# Patient Record
Sex: Female | Born: 1940 | Race: White | Hispanic: No | Marital: Married | Smoking: Never smoker
Health system: Southern US, Community
[De-identification: ages and names within clinical notes are randomized; demographics above are authoritative.]

## PROBLEM LIST (undated history)

## (undated) ENCOUNTER — Emergency Department (HOSPITAL_BASED_OUTPATIENT_CLINIC_OR_DEPARTMENT_OTHER): Admission: EM | Source: Home / Self Care

## (undated) DIAGNOSIS — K219 Gastro-esophageal reflux disease without esophagitis: Secondary | ICD-10-CM

## (undated) DIAGNOSIS — Z8719 Personal history of other diseases of the digestive system: Secondary | ICD-10-CM

## (undated) DIAGNOSIS — C801 Malignant (primary) neoplasm, unspecified: Secondary | ICD-10-CM

## (undated) DIAGNOSIS — M8580 Other specified disorders of bone density and structure, unspecified site: Secondary | ICD-10-CM

## (undated) DIAGNOSIS — M1990 Unspecified osteoarthritis, unspecified site: Secondary | ICD-10-CM

## (undated) DIAGNOSIS — E213 Hyperparathyroidism, unspecified: Secondary | ICD-10-CM

## (undated) DIAGNOSIS — R21 Rash and other nonspecific skin eruption: Secondary | ICD-10-CM

## (undated) DIAGNOSIS — I10 Essential (primary) hypertension: Secondary | ICD-10-CM

## (undated) HISTORY — PX: OTHER SURGICAL HISTORY: SHX169

## (undated) HISTORY — PX: TONSILLECTOMY: SUR1361

---

## 1982-11-29 HISTORY — PX: APPENDECTOMY: SHX54

## 1982-11-29 HISTORY — PX: ABDOMINAL HYSTERECTOMY: SHX81

## 1985-11-29 HISTORY — PX: OTHER SURGICAL HISTORY: SHX169

## 1998-11-11 ENCOUNTER — Other Ambulatory Visit: Admission: RE | Admit: 1998-11-11 | Discharge: 1998-11-11 | Payer: Self-pay | Admitting: Obstetrics and Gynecology

## 1999-11-09 ENCOUNTER — Ambulatory Visit (HOSPITAL_BASED_OUTPATIENT_CLINIC_OR_DEPARTMENT_OTHER): Admission: RE | Admit: 1999-11-09 | Discharge: 1999-11-09 | Payer: Self-pay | Admitting: Orthopedic Surgery

## 1999-11-30 HISTORY — PX: LAMINECTOMY: SHX219

## 2000-03-08 ENCOUNTER — Ambulatory Visit (HOSPITAL_COMMUNITY): Admission: RE | Admit: 2000-03-08 | Discharge: 2000-03-08 | Payer: Self-pay | Admitting: Orthopedic Surgery

## 2000-03-08 ENCOUNTER — Encounter: Payer: Self-pay | Admitting: Orthopedic Surgery

## 2000-09-15 ENCOUNTER — Encounter: Admission: RE | Admit: 2000-09-15 | Discharge: 2000-09-15 | Payer: Self-pay

## 2002-02-01 ENCOUNTER — Encounter: Admission: RE | Admit: 2002-02-01 | Discharge: 2002-02-01 | Payer: Self-pay

## 2003-05-02 ENCOUNTER — Encounter: Admission: RE | Admit: 2003-05-02 | Discharge: 2003-05-02 | Payer: Self-pay

## 2004-02-28 HISTORY — PX: OTHER SURGICAL HISTORY: SHX169

## 2004-03-11 ENCOUNTER — Ambulatory Visit (HOSPITAL_COMMUNITY): Admission: RE | Admit: 2004-03-11 | Discharge: 2004-03-11 | Payer: Self-pay | Admitting: Orthopedic Surgery

## 2004-03-11 ENCOUNTER — Ambulatory Visit (HOSPITAL_BASED_OUTPATIENT_CLINIC_OR_DEPARTMENT_OTHER): Admission: RE | Admit: 2004-03-11 | Discharge: 2004-03-11 | Payer: Self-pay | Admitting: Orthopedic Surgery

## 2004-05-29 ENCOUNTER — Encounter: Admission: RE | Admit: 2004-05-29 | Discharge: 2004-05-29 | Payer: Self-pay

## 2005-10-04 ENCOUNTER — Encounter: Admission: RE | Admit: 2005-10-04 | Discharge: 2005-10-04 | Payer: Self-pay

## 2007-03-16 ENCOUNTER — Encounter: Admission: RE | Admit: 2007-03-16 | Discharge: 2007-03-16 | Payer: Self-pay

## 2008-04-03 ENCOUNTER — Encounter: Admission: RE | Admit: 2008-04-03 | Discharge: 2008-04-03 | Payer: Self-pay | Admitting: Family Medicine

## 2010-05-06 ENCOUNTER — Encounter: Admission: RE | Admit: 2010-05-06 | Discharge: 2010-05-06 | Payer: Self-pay | Admitting: Internal Medicine

## 2010-11-29 HISTORY — PX: ANKLE SURGERY: SHX546

## 2011-04-16 NOTE — Op Note (Signed)
NAME:  Hannah Chen, Hannah Chen                          ACCOUNT NO.:  [id number]   MEDICAL RECORD NO.:  [id number]                   PATIENT TYPE:  AMB   LOCATION:  DSC                                  FACILITY:  [facility]   PHYSICIAN:  Feliberto Gottron. Turner Daniels, M.D.                DATE OF BIRTH:  1941/05/28   DATE OF PROCEDURE:  03/11/2004  DATE OF DISCHARGE:                                 OPERATIVE REPORT   PREOPERATIVE DIAGNOSES:  1. Left knee chondromalacia.  2. Possible loose bodies or meniscal tear.   POSTOPERATIVE DIAGNOSES:  1. Left knee chondromalacia of the patella, focal grade 4, global grade 3.  2. 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  3. Multiple cartilaginous loose bodies.  4. Lateral meniscal tear.   PROCEDURE:  Left knee arthroscopic debridement of above.   SURGEON:  Feliberto Gottron. Turner Daniels, M.D.   FIRST ASSISTANT:  Erskine Squibb B. Jannet Mantis.   ANESTHESIA:  General LMA.   ESTIMATED BLOOD LOSS:  Minimal.   FLUIDS REPLACED:  800 mL of crystalloid.   DRAINS:  None.   TOURNIQUET TIME:  None.   INDICATION FOR PROCEDURE:  A 70 year old woman who is the wife of one of my  former partners, who has had catching, popping, and pain in her left knee  since a direct blow to the knee some months ago.  Because of mechanical  symptoms and x-ray showing some loss of medial compartment articular  cartilage, she is taken for arthroscopic evaluation and treatment of her  left knee, having failed conservative treatment with exercise, anti-  inflammatory medicine, and observation.   DESCRIPTION OF PROCEDURE:  Patient identified by arm band, taken to the  operating room at [facility], appropriate anesthetic monitors  were attached, and general LMA induced with the patient in supine position.  Lateral post applied to the table and the left lower extremity prepped and  draped in the usual sterile fashion from the ankle to the midthigh.  The  inferomedial and  inferolateral peripatellar regions were infiltrated with 2-  3 mL of 0.5% Marcaine and epinephrine solution, and standard portals were  then made with a #11 blade in the inferomedial and inferolateral positions.  The arthroscope inserted through the inferolateral portal and the knee  inflated to 40-60 mmHg pressure with normal saline.  Diagnostic arthroscopy  revealed grade 4 focal and grade 3 global chondromalacia of the patella,  which was debrided.  The trochlea was in relatively good condition.  The  articular cartilage of the medial compartment had grade 3 chondromalacia  with small flap tears and was debrided and multiple cartilaginous loose  bodies were taken through the outflow using the inferomedial portal.  The  ACL and the PCL were intact.  On the lateral side the patient had a complex  parrot beak tear of the posterolateral corner of the lateral meniscus.  This  was debrided and a large loose body removed as well.  Grade 3 chondromalacia  of the lateral femoral condyle was also debrided at this time.  The gutters  were cleared.  The scope was taken medial and lateral to the PCL, clearing  the posterior compartments as well.  The knee was thoroughly irrigated out  with normal saline solution, the arthroscopic instruments removed, and a  dressing of Xeroform, 4 x 4 dressing sponges, Webril, and an Ace wrap  applied.  The patient awakened and taken to the recovery room without  difficulty.                                               Feliberto Gottron. Turner Daniels, M.D.    Ovid Curd  D:  03/11/2004  T:  03/12/2004  Job:  [id number]

## 2011-04-16 NOTE — Op Note (Signed)
[facility]  Patient:    Hannah Chen                        MRN: [id number] Proc. Date: 11/09/99 Adm. 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  PROCEDURE: Right shoulder arthroscopic removal of subacromial spur and debridement of bursal side tearing of the rotator cuff about 5% with inflamed bursa.  SURGEON: Alinda Deem, M.D.  ASSISTANT: Dorthula Matas, P.A.-C.  ANESTHESIA: General endotracheal.  ESTIMATED BLOOD LOSS: Minimal.  FLUID REPLACEMENT: 800 cc of crystalloid.  DRAINS PLACED: None.  TOURNIQUET TIME: None.  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  About six years ago Dr. Eulah Pont did a distal clavicle excision for [facility] joint arthritis on the right side and she has developed impingement syndrome over the last couple of years.  She as failed conservative treatment with anti-inflammatory, therapy and exercises and  only got temporary relief from a cortisone injection.  Because of this and radiographs showing a 1 cm type 2-3 subacromial spur she is taken for arthroscopic decompression of the right shoulder with popping, catching and pain that prevents function and wakes her up at night.  Clinically I do not believe she has a rotator cuff tear based on the rotator cuff strength.  DESCRIPTION OF PROCEDURE:  The patient was identified by arm band and was taken to the operating room at [facility]. Appropriate anesthetic monitors were attached.  General endotracheal anesthesia was induced  with the patient in supine position.  She was then placed in the beach  chair position and her right upper extremity was prepped and draped in the usual sterile fashion from the wrist to the hemithorax.  The skin along the anterior lateral nd posterior aspects of the acromion process was infiltrated with 1-2 cc of 1/2% Marcaine and epinephrine solution.  Another 8 cc was placed in the subacromial space.  She did receive 1 gm of Ancef perioperatively since she previously had surgery to the shoulder.  Using a #11 blade, standard portals were then made 1.5 cm anterior to the [facility] joint, lateral to the junction of the middle and posterior thirds of the acromion, posterior to the posterolateral corner of the acromion process.  The inflow was  placed anteriorly, the arthroscope laterally and a 4.2 mm great white sucker shaver from ______ posteriorly allowing removal of inflamed subacromial bursa, resection of a remnant of the CL ligament and outlining of the subacromial spur which was 1 cm in thickness and had a type 3 configuration.  Using a 4.5 mm hooded vortex bur, we then resected the subacromial spur without  difficulty and it was noted to be sitting right on top of the rotator cuff where the cuff had some 5% external tearing and roughening secondary to the spur. The shoulder was taken through a range of motion documenting no full thickness tearing of the rotator cuff and the subacromial space was washed out with normal saline  solution.  The arthroscope was then repositioned into the glenohumeral joint from the  posterior portal with inflow attached to the scope itself and diagnostic arthroscopy of the glenohumeral joint was undertaken.  The subscapularis tendon, infraspinatus and supraspinatus tendons were intact with a slight amount of inflammation of the musculotendinous junction secondary to the preexisting spur. The biceps tendon was intact as was the articular cartilage and labrum of the glenohumeral joint.  At this point the shoulder was  once again washed out with normal saline solution. The arthroscopic instruments were removed.  A dressing of Xeroform, 4x4 dressings, sponges and paper tape were applied.  The patient was awakened and taken to the  recovery room without difficulty. DD:  11/09/99 TD:  11/09/99 Job: [id number]

## 2014-11-29 HISTORY — PX: CATARACT EXTRACTION: SUR2

## 2015-04-23 ENCOUNTER — Other Ambulatory Visit: Payer: Self-pay | Admitting: Orthopedic Surgery

## 2015-06-05 ENCOUNTER — Encounter (HOSPITAL_COMMUNITY): Payer: Self-pay

## 2015-06-05 ENCOUNTER — Encounter (HOSPITAL_COMMUNITY)
Admission: RE | Admit: 2015-06-05 | Discharge: 2015-06-05 | Disposition: A | Discharge status: Home / Self Care | Payer: Medicare Other | Source: Ambulatory Visit | Attending: Orthopedic Surgery | Admitting: Orthopedic Surgery

## 2015-06-05 DIAGNOSIS — Z0183 Encounter for blood typing: Secondary | ICD-10-CM | POA: Diagnosis not present

## 2015-06-05 DIAGNOSIS — Z01812 Encounter for preprocedural laboratory examination: Secondary | ICD-10-CM | POA: Insufficient documentation

## 2015-06-05 DIAGNOSIS — I10 Essential (primary) hypertension: Secondary | ICD-10-CM | POA: Diagnosis not present

## 2015-06-05 DIAGNOSIS — Z01818 Encounter for other preprocedural examination: Secondary | ICD-10-CM

## 2015-06-05 DIAGNOSIS — M179 Osteoarthritis of knee, unspecified: Secondary | ICD-10-CM | POA: Insufficient documentation

## 2015-06-05 DIAGNOSIS — K449 Diaphragmatic hernia without obstruction or gangrene: Secondary | ICD-10-CM | POA: Diagnosis not present

## 2015-06-05 DIAGNOSIS — K219 Gastro-esophageal reflux disease without esophagitis: Secondary | ICD-10-CM | POA: Insufficient documentation

## 2015-06-05 HISTORY — DX: Personal history of other diseases of the digestive system: Z87.19

## 2015-06-05 HISTORY — DX: Gastro-esophageal reflux disease without esophagitis: K21.9

## 2015-06-05 HISTORY — DX: Essential (primary) hypertension: I10

## 2015-06-05 HISTORY — DX: Unspecified osteoarthritis, unspecified site: M19.90

## 2015-06-05 LAB — TYPE AND SCREEN
ABO/RH(D): A POS
Antibody Screen: NEGATIVE

## 2015-06-05 LAB — CBC WITH DIFFERENTIAL/PLATELET
BASOS ABS: 0 10*3/uL (ref 0.0–0.1)
BASOS PCT: 0 % (ref 0–1)
EOS ABS: 0.3 10*3/uL (ref 0.0–0.7)
Eosinophils Relative: 3 % (ref 0–5)
HCT: 43.4 % (ref 36.0–46.0)
HEMOGLOBIN: 14.7 g/dL (ref 12.0–15.0)
Lymphocytes Relative: 20 % (ref 12–46)
Lymphs Abs: 2 10*3/uL (ref 0.7–4.0)
MCH: 31.3 pg (ref 26.0–34.0)
MCHC: 33.9 g/dL (ref 30.0–36.0)
MCV: 92.5 fL (ref 78.0–100.0)
Monocytes Absolute: 0.8 10*3/uL (ref 0.1–1.0)
Monocytes Relative: 8 % (ref 3–12)
NEUTROS ABS: 6.9 10*3/uL (ref 1.7–7.7)
NEUTROS PCT: 69 % (ref 43–77)
PLATELETS: 344 10*3/uL (ref 150–400)
RBC: 4.69 MIL/uL (ref 3.87–5.11)
RDW: 13.4 % (ref 11.5–15.5)
WBC: 10 10*3/uL (ref 4.0–10.5)

## 2015-06-05 LAB — BASIC METABOLIC PANEL
Anion gap: 10 (ref 5–15)
BUN: 17 mg/dL (ref 6–20)
CHLORIDE: 104 mmol/L (ref 101–111)
CO2: 27 mmol/L (ref 22–32)
Calcium: 10.2 mg/dL (ref 8.9–10.3)
Creatinine, Ser: 0.82 mg/dL (ref 0.44–1.00)
GFR calc Af Amer: >60 mL/min (ref >60)
GFR calc non Af Amer: >60 mL/min (ref >60)
GLUCOSE: 105 mg/dL — AB (ref 65–99)
POTASSIUM: 4.1 mmol/L (ref 3.5–5.1)
Sodium: 141 mmol/L (ref 135–145)

## 2015-06-05 LAB — ABO/RH: ABO/RH(D): A POS

## 2015-06-05 LAB — URINALYSIS, ROUTINE W REFLEX MICROSCOPIC
BILIRUBIN URINE: NEGATIVE
Glucose, UA: NEGATIVE mg/dL
HGB URINE DIPSTICK: NEGATIVE
Ketones, ur: NEGATIVE mg/dL
Leukocytes, UA: NEGATIVE
Nitrite: NEGATIVE
PH: 7 (ref 5.0–8.0)
PROTEIN: NEGATIVE mg/dL
Specific Gravity, Urine: 1.007 (ref 1.005–1.030)
UROBILINOGEN UA: 0.2 mg/dL (ref 0.0–1.0)

## 2015-06-05 LAB — SURGICAL PCR SCREEN
MRSA, PCR: NEGATIVE
Staphylococcus aureus: NEGATIVE

## 2015-06-05 LAB — PROTIME-INR
INR: 1.01 (ref 0.00–1.49)
Prothrombin Time: 13.5 seconds (ref 11.6–15.2)

## 2015-06-05 LAB — APTT: aPTT: 29 seconds (ref 24–37)

## 2015-06-05 NOTE — Progress Notes (Addendum)
Anesthesia PAT Evaluation: Patient is a 74 year old female scheduled for left TKA on 06/16/15 Dr. Mayer Camel.  History includes HTN, GERD, hiatal hernia, arthritis, non-smoker, hysterectomy, appendectomy, lumbar laminectomy X 2, tonsillectomy, cataract, ankle surgery '12. Her husband is a retired Doctor, general practice who practiced here in [place]. He has been retired for 19 years.  Patient is a retired Marine scientist. They now split their time between [place], [place] and [place], [place]. PCP is Sylva is Dr. Evaristo Bury.  PCP is Jonelle Sidle is Dr. Lum Babe.   She reports EKG a few months ago in [place] as part of a routine physical. Remote stress test in 1995. She denied chest pain, SOB. Exam showed a pleasant Caucasian female in NAD. Heart RRR, no murmur. Lungs clear. No carotid bruits. Good mouth opening.   Medications include amlodipine, losartan.  06/05/15 CXR: IMPRESSION: Hyperinflation may be voluntary or could reflect underlying COPD. There is no active cardiopulmonary disease.  Preoperative labs noted.   Patient and her husband wanted to speak with an anesthesia representative during her PAT visit due to questions regarding anesthesia options. She has had two back surgeries and is concerned that she may not be the best candidate for spinal anesthesia. She is also concerned about being partially aware if spinal anesthesia is used.  Apparently, Dr. Mayer Camel has discussed that he will likely use Exparel.  At this time, patient favors having this rather than a femoral or adductor canal block. She has done well with general anesthesia in the past. She does have a front bridge that is "cracked" but denied history of difficult intubation or limitation in neck movement. She denied blood thinners, bleeding disorder, or known valvular disease. We discussed that her anesthesiologist may prefer general over spinal anesthesia (or decide to limit attempts at spinal) due to her previous back surgeries, but typically previous  back surgery is not considered an absolute contraindication for spinal anesthesia.  I offered to have her speak with one of our anesthesiologists today, but she and Dr. Vernon Prey felt I had provided them the information needed and were comfortable discussing further with her assigned anesthesiologist on the morning of surgery and then determining the definitive anesthesia plan at that time.   Additional follow-up once EKG received.   George Hugh [facility] Short Stay Center/Anesthesiology Phone [phone] 06/05/2015 2:42 PM  Addendum: EKG from 01/27/15 received and showed NSR, poor R wave progression in V3-4. Dr. Ethel Rana note indicates tracing without acute change since 01/29/14.  George Hugh [facility] Short Stay Center/Anesthesiology Phone [phone] 06/09/2015 11:42 AM

## 2015-06-05 NOTE — Anesthesia Preprocedure Evaluation (Addendum)
Anesthesia Evaluation  Patient identified by MRN, date of birth, ID band Patient awake    Reviewed: Allergy & Precautions, NPO status , Patient's Chart, lab work & pertinent test results  History of Anesthesia Complications Negative for: history of anesthetic complications  Airway Mallampati: II  TM Distance: >3 FB Neck ROM: Full    Dental  (+) Teeth Intact, Caps, Dental Advisory Given   Pulmonary neg pulmonary ROS,    Pulmonary exam normal       Cardiovascular hypertension, Pt. on medications Normal cardiovascular exam    Neuro/Psych negative neurological ROS  negative psych ROS   GI/Hepatic Neg liver ROS, hiatal hernia, GERD-  ,  Endo/Other  negative endocrine ROS  Renal/GU negative Renal ROS     Musculoskeletal   Abdominal   Peds  Hematology   Anesthesia Other Findings   Reproductive/Obstetrics                           Anesthesia Physical Anesthesia Plan  ASA: III  Anesthesia Plan: MAC and Spinal   Post-op Pain Management:    Induction:   Airway Management Planned: Simple Face Mask  Additional Equipment:   Intra-op Plan:   Post-operative Plan:   Informed Consent: I have reviewed the patients History and Physical, chart, labs and discussed the procedure including the risks, benefits and alternatives for the proposed anesthesia with the patient or authorized representative who has indicated his/her understanding and acceptance.   Dental advisory given  Plan Discussed with: CRNA, Anesthesiologist and Surgeon  Anesthesia Plan Comments: (See my anesthesia note. Patient contemplating spinal vs GA.  Myra Gianotti, PA-C)      Anesthesia Quick Evaluation

## 2015-06-05 NOTE — Pre-Procedure Instructions (Signed)
    Hannah Chen  06/05/2015      [id number] - [place], [place] - [address] [place] [facility] [address] Phone: [phone] Fax: [phone]      Your procedure is scheduled on Monday, July 18  Report to [facility] Main Entrance "A" at 5:30 A.M.  Call this number if you have problems the morning of surgery:  [phone]               Any questions prior to surgery call [phone] (Monday-Friday 8am-4pm)   Remember:  Do not eat food or drink liquids after midnight.  Take these medicines the morning of surgery with A SIP OF WATER: amlodipine   Do not wear jewelry, make-up or nail polish.  Do not wear lotions, powders, or perfumes.  You may wear deodorant.  Do not shave 48 hours prior to surgery.  Men may shave face and neck.  Do not bring valuables to the hospital.  [facility] is not responsible for any belongings or valuables.  Contacts, dentures or bridgework may not be worn into surgery.  Leave your suitcase in the car.  After surgery it may be brought to your room.  For patients admitted to the hospital, discharge time will be determined by your treatment team.  Patients discharged the day of surgery will not be allowed to drive home.   Name and phone number of your driver:    Special instructions:  Review preparing for surgery handout  Please read over the following fact sheets that you were given. Pain Booklet, Coughing and Deep Breathing, Blood Transfusion Information, MRSA Information and Surgical Site Infection Prevention

## 2015-06-05 NOTE — Progress Notes (Signed)
PCP; Dr. Zettie Cooley in [place], [place] and Dr. Jerene Dilling in [place], [place].( [phone]) phone #

## 2015-06-14 NOTE — H&P (Signed)
TOTAL KNEE ADMISSION H&P  Patient is being admitted for left total knee arthroplasty.  Subjective:  Chief Complaint:left knee pain.  HPI: Hannah Chen, 74 y.o. female, has a history of pain and functional disability in the left knee due to arthritis and has failed non-surgical conservative treatments for greater than 12 weeks to includeNSAID's and/or analgesics, corticosteriod injections, flexibility and strengthening excercises, weight reduction as appropriate and activity modification.  Onset of symptoms was gradual, starting 2 years ago with gradually worsening course since that time. The patient noted prior procedures on the knee to include  arthroscopy on the left knee(s).  Patient currently rates pain in the left knee(s) at 10 out of 10 with activity. Patient has night pain, worsening of pain with activity and weight bearing, pain that interferes with activities of daily living, pain with passive range of motion and crepitus.  Patient has evidence of subchondral sclerosis and joint space narrowing by imaging studies.  There is no active infection.  There are no active problems to display for this patient.  Past Medical History  Diagnosis Date  . Hypertension   . GERD (gastroesophageal reflux disease)   . History of hiatal hernia   . Arthritis     Past Surgical History  Procedure Laterality Date  . Ankle surgery Left 2012  . Laminectomy  2001  . Abdominal hysterectomy  1984  . Tonsillectomy    . Appendectomy    . Cataract extraction Bilateral     No prescriptions prior to admission   Allergies  Allergen Reactions  . Sulfa Antibiotics Hives    History  Substance Use Topics  . Smoking status: Never Smoker   . Smokeless tobacco: Not on file  . Alcohol Use: Yes     Comment: social    No family history on file.   Review of Systems  Cardiovascular: Positive for leg swelling.  Gastrointestinal: Positive for heartburn.  Genitourinary: Positive for urgency.   Musculoskeletal: Positive for joint pain.  Skin: Positive for rash.  Endo/Heme/Allergies: Bruises/bleeds easily.    Objective:  Physical Exam  Constitutional: She is oriented to person, place, and time. She appears well-developed and well-nourished.  HENT:  Head: Normocephalic and atraumatic.  Eyes: Pupils are equal, round, and reactive to light.  Neck: Normal range of motion. Neck supple.  Cardiovascular: Intact distal pulses.   Respiratory: Effort normal.  Musculoskeletal: She exhibits tenderness.  The left knee has a 5 valgus deformity tender along the lateral joint line.  She has a 10 flexion contracture and can flex to 120.  Her skin is intact.  She is neurovascularly intact.  Normal pulses to the foot.  Neurological: She is alert and oriented to person, place, and time.  Skin: Skin is warm and dry.  Psychiatric: She has a normal mood and affect. Her behavior is normal. Judgment and thought content normal.    Vital signs in last 24 hours:    Labs:   There is no height or weight on file to calculate BMI.   Imaging Review Plain radiographs demonstrate near bone-on-bone arthritic changes to the lateral compartment of the right knee.    Assessment/Plan:  End stage arthritis, left knee   The patient history, physical examination, clinical judgment of the provider and imaging studies are consistent with end stage degenerative joint disease of the left knee(s) and total knee arthroplasty is deemed medically necessary. The treatment options including medical management, injection therapy arthroscopy and arthroplasty were discussed at length. The risks and benefits of  total knee arthroplasty were presented and reviewed. The risks due to aseptic loosening, infection, stiffness, patella tracking problems, thromboembolic complications and other imponderables were discussed. The patient acknowledged the explanation, agreed to proceed with the plan and consent was signed. Patient is  being admitted for inpatient treatment for surgery, pain control, PT, OT, prophylactic antibiotics, VTE prophylaxis, progressive ambulation and ADL's and discharge planning. The patient is planning to be discharged home with home health services

## 2015-06-16 ENCOUNTER — Encounter (HOSPITAL_COMMUNITY)
Admission: RE | Disposition: A | Discharge status: Home Health | Payer: Self-pay | Source: Ambulatory Visit | Attending: Orthopedic Surgery

## 2015-06-16 ENCOUNTER — Inpatient Hospital Stay (HOSPITAL_COMMUNITY)
Admission: RE | Admit: 2015-06-16 | Discharge: 2015-06-19 | DRG: 470 | Disposition: A | Discharge status: Home Health | Payer: Medicare Other | Source: Ambulatory Visit | Attending: Orthopedic Surgery | Admitting: Orthopedic Surgery

## 2015-06-16 ENCOUNTER — Encounter (HOSPITAL_COMMUNITY): Payer: Self-pay

## 2015-06-16 ENCOUNTER — Inpatient Hospital Stay (HOSPITAL_COMMUNITY): Payer: Medicare Other | Admitting: Anesthesiology

## 2015-06-16 ENCOUNTER — Inpatient Hospital Stay (HOSPITAL_COMMUNITY): Payer: Medicare Other | Admitting: Vascular Surgery

## 2015-06-16 DIAGNOSIS — I10 Essential (primary) hypertension: Secondary | ICD-10-CM | POA: Diagnosis present

## 2015-06-16 DIAGNOSIS — M1712 Unilateral primary osteoarthritis, left knee: Principal | ICD-10-CM | POA: Diagnosis present

## 2015-06-16 DIAGNOSIS — M25562 Pain in left knee: Secondary | ICD-10-CM | POA: Diagnosis present

## 2015-06-16 DIAGNOSIS — M1710 Unilateral primary osteoarthritis, unspecified knee: Secondary | ICD-10-CM | POA: Diagnosis present

## 2015-06-16 HISTORY — PX: TOTAL KNEE ARTHROPLASTY: SHX125

## 2015-06-16 LAB — GLUCOSE, CAPILLARY: Glucose-Capillary: 144 mg/dL — ABNORMAL HIGH (ref 65–99)

## 2015-06-16 SURGERY — ARTHROPLASTY, KNEE, TOTAL
Anesthesia: Monitor Anesthesia Care | Site: Knee | Laterality: Left

## 2015-06-16 MED ORDER — DOCUSATE SODIUM 100 MG PO CAPS
100.0000 mg | ORAL_CAPSULE | Freq: Two times a day (BID) | ORAL | Status: DC
  Administered 2015-06-16 – 2015-06-19 (×6): 100 mg via ORAL
  Filled 2015-06-16 (×6): qty 1

## 2015-06-16 MED ORDER — ALUM & MAG HYDROXIDE-SIMETH 200-200-20 MG/5ML PO SUSP
30.0000 mL | ORAL | Status: DC | PRN
Start: 2015-06-16 — End: 2015-06-19

## 2015-06-16 MED ORDER — HYDROMORPHONE HCL 1 MG/ML IJ SOLN
INTRAMUSCULAR | Status: AC
  Filled 2015-06-16: qty 1

## 2015-06-16 MED ORDER — OXYCODONE-ACETAMINOPHEN 5-325 MG PO TABS: 1.0000 | ORAL_TABLET | ORAL | Status: DC | PRN

## 2015-06-16 MED ORDER — DEXAMETHASONE SODIUM PHOSPHATE 4 MG/ML IJ SOLN
INTRAMUSCULAR | Status: DC | PRN
  Administered 2015-06-16: 4 mg via INTRAVENOUS

## 2015-06-16 MED ORDER — SODIUM CHLORIDE 0.9 % IV SOLN
10.0000 mg | INTRAVENOUS | Status: DC | PRN
  Administered 2015-06-16: 10 ug/min via INTRAVENOUS

## 2015-06-16 MED ORDER — CEFAZOLIN SODIUM-DEXTROSE 2-3 GM-% IV SOLR
2.0000 g | INTRAVENOUS | Status: AC
  Administered 2015-06-16: 2 g via INTRAVENOUS

## 2015-06-16 MED ORDER — OXYCODONE HCL 5 MG PO TABS
ORAL_TABLET | ORAL | Status: AC
  Filled 2015-06-16: qty 1

## 2015-06-16 MED ORDER — SODIUM CHLORIDE 0.9 % IJ SOLN
INTRAMUSCULAR | Status: DC | PRN
  Administered 2015-06-16: 40 mL via INTRAVENOUS

## 2015-06-16 MED ORDER — ONDANSETRON HCL 4 MG/2ML IJ SOLN
4.0000 mg | Freq: Four times a day (QID) | INTRAMUSCULAR | Status: DC | PRN
  Administered 2015-06-16 – 2015-06-17 (×3): 4 mg via INTRAVENOUS
  Filled 2015-06-16 (×3): qty 2

## 2015-06-16 MED ORDER — SODIUM CHLORIDE 0.9 % IR SOLN
Status: DC | PRN
  Administered 2015-06-16: 3000 mL
  Administered 2015-06-16: 1000 mL

## 2015-06-16 MED ORDER — BUPIVACAINE LIPOSOME 1.3 % IJ SUSP
20.0000 mL | INTRAMUSCULAR | Status: AC
  Administered 2015-06-16: 20 mL
  Filled 2015-06-16: qty 20

## 2015-06-16 MED ORDER — ACETAMINOPHEN 325 MG PO TABS
650.0000 mg | ORAL_TABLET | Freq: Four times a day (QID) | ORAL | Status: DC | PRN
  Administered 2015-06-17 – 2015-06-18 (×2): 650 mg via ORAL
  Filled 2015-06-16 (×2): qty 2

## 2015-06-16 MED ORDER — KCL IN DEXTROSE-NACL 20-5-0.45 MEQ/L-%-% IV SOLN
INTRAVENOUS | Status: DC
  Administered 2015-06-16: 125 mL/h via INTRAVENOUS
  Administered 2015-06-16 – 2015-06-17 (×2): via INTRAVENOUS
  Filled 2015-06-16 (×2): qty 1000

## 2015-06-16 MED ORDER — ONDANSETRON HCL 4 MG/2ML IJ SOLN
INTRAMUSCULAR | Status: DC | PRN
  Administered 2015-06-16: 4 mg via INTRAVENOUS

## 2015-06-16 MED ORDER — METOCLOPRAMIDE HCL 5 MG PO TABS: 5.0000 mg | ORAL_TABLET | Freq: Three times a day (TID) | ORAL | Status: DC | PRN

## 2015-06-16 MED ORDER — ACETAMINOPHEN 650 MG RE SUPP: 650.0000 mg | Freq: Four times a day (QID) | RECTAL | Status: DC | PRN

## 2015-06-16 MED ORDER — ACETAMINOPHEN 10 MG/ML IV SOLN
1000.0000 mg | Freq: Once | INTRAVENOUS | Status: AC
  Administered 2015-06-16: 1000 mg via INTRAVENOUS

## 2015-06-16 MED ORDER — PHENYLEPHRINE HCL 10 MG/ML IJ SOLN
INTRAMUSCULAR | Status: AC
  Filled 2015-06-16: qty 1

## 2015-06-16 MED ORDER — DEXTROSE-NACL 5-0.45 % IV SOLN: INTRAVENOUS | Status: DC

## 2015-06-16 MED ORDER — KCL IN DEXTROSE-NACL 20-5-0.45 MEQ/L-%-% IV SOLN
INTRAVENOUS | Status: AC
  Filled 2015-06-16: qty 1000

## 2015-06-16 MED ORDER — DIPHENHYDRAMINE HCL 12.5 MG/5ML PO ELIX: 12.5000 mg | ORAL_SOLUTION | ORAL | Status: DC | PRN

## 2015-06-16 MED ORDER — CEFUROXIME SODIUM 1.5 G IJ SOLR
INTRAMUSCULAR | Status: DC | PRN
  Administered 2015-06-16: 1.5 g

## 2015-06-16 MED ORDER — MIDAZOLAM HCL 5 MG/5ML IJ SOLN
INTRAMUSCULAR | Status: DC | PRN
  Administered 2015-06-16: 2 mg via INTRAVENOUS

## 2015-06-16 MED ORDER — BISACODYL 5 MG PO TBEC: 5.0000 mg | DELAYED_RELEASE_TABLET | Freq: Every day | ORAL | Status: DC | PRN

## 2015-06-16 MED ORDER — FENTANYL CITRATE (PF) 100 MCG/2ML IJ SOLN
INTRAMUSCULAR | Status: DC | PRN
  Administered 2015-06-16: 25 ug via INTRAVENOUS

## 2015-06-16 MED ORDER — MIDAZOLAM HCL 2 MG/2ML IJ SOLN
1.0000 mg | Freq: Once | INTRAMUSCULAR | Status: AC
  Administered 2015-06-16: 1 mg via INTRAVENOUS

## 2015-06-16 MED ORDER — PROPOFOL 10 MG/ML IV BOLUS
INTRAVENOUS | Status: AC
  Filled 2015-06-16: qty 20

## 2015-06-16 MED ORDER — FENTANYL CITRATE (PF) 250 MCG/5ML IJ SOLN
INTRAMUSCULAR | Status: AC
  Filled 2015-06-16: qty 5

## 2015-06-16 MED ORDER — ONDANSETRON HCL 4 MG/2ML IJ SOLN
INTRAMUSCULAR | Status: AC
  Filled 2015-06-16: qty 2

## 2015-06-16 MED ORDER — HYDROMORPHONE HCL 1 MG/ML IJ SOLN
0.2500 mg | INTRAMUSCULAR | Status: DC | PRN
  Administered 2015-06-16: 1 mg via INTRAVENOUS
  Administered 2015-06-16 (×2): 0.5 mg via INTRAVENOUS

## 2015-06-16 MED ORDER — AMLODIPINE BESYLATE 10 MG PO TABS
10.0000 mg | ORAL_TABLET | Freq: Every morning | ORAL | Status: DC
  Administered 2015-06-17 – 2015-06-19 (×3): 10 mg via ORAL
  Filled 2015-06-16 (×3): qty 1

## 2015-06-16 MED ORDER — LACTATED RINGERS IV SOLN
INTRAVENOUS | Status: DC | PRN
  Administered 2015-06-16 (×2): via INTRAVENOUS

## 2015-06-16 MED ORDER — DEXAMETHASONE SODIUM PHOSPHATE 4 MG/ML IJ SOLN
INTRAMUSCULAR | Status: AC
  Filled 2015-06-16: qty 1

## 2015-06-16 MED ORDER — METHOCARBAMOL 1000 MG/10ML IJ SOLN
500.0000 mg | INTRAVENOUS | Status: AC
  Administered 2015-06-16: 500 mg via INTRAVENOUS
  Filled 2015-06-16: qty 5

## 2015-06-16 MED ORDER — FLEET ENEMA 7-19 GM/118ML RE ENEM: 1.0000 | ENEMA | Freq: Once | RECTAL | Status: AC | PRN

## 2015-06-16 MED ORDER — LIDOCAINE HCL (CARDIAC) 20 MG/ML IV SOLN
INTRAVENOUS | Status: AC
  Filled 2015-06-16: qty 5

## 2015-06-16 MED ORDER — LIDOCAINE HCL (CARDIAC) 20 MG/ML IV SOLN
INTRAVENOUS | Status: DC | PRN
  Administered 2015-06-16: 100 mg via INTRAVENOUS

## 2015-06-16 MED ORDER — ASPIRIN EC 325 MG PO TBEC: 325.0000 mg | DELAYED_RELEASE_TABLET | Freq: Two times a day (BID) | ORAL | Status: DC

## 2015-06-16 MED ORDER — PROPOFOL 10 MG/ML IV BOLUS
INTRAVENOUS | Status: DC | PRN
  Administered 2015-06-16 (×3): 20 mg via INTRAVENOUS
  Administered 2015-06-16: 30 mg via INTRAVENOUS

## 2015-06-16 MED ORDER — OXYCODONE HCL 5 MG PO TABS
5.0000 mg | ORAL_TABLET | ORAL | Status: DC | PRN
  Administered 2015-06-16: 5 mg via ORAL
  Administered 2015-06-16 (×2): 10 mg via ORAL
  Administered 2015-06-16: 5 mg via ORAL
  Administered 2015-06-17 – 2015-06-19 (×13): 10 mg via ORAL
  Filled 2015-06-16 (×15): qty 2

## 2015-06-16 MED ORDER — MIDAZOLAM HCL 2 MG/2ML IJ SOLN
INTRAMUSCULAR | Status: AC
  Filled 2015-06-16: qty 2

## 2015-06-16 MED ORDER — METHOCARBAMOL 500 MG PO TABS
500.0000 mg | ORAL_TABLET | Freq: Four times a day (QID) | ORAL | Status: DC | PRN
  Administered 2015-06-16 – 2015-06-19 (×8): 500 mg via ORAL
  Filled 2015-06-16 (×8): qty 1

## 2015-06-16 MED ORDER — BUPIVACAINE IN DEXTROSE 0.75-8.25 % IT SOLN
INTRATHECAL | Status: DC | PRN
  Administered 2015-06-16: 15 mg via INTRATHECAL

## 2015-06-16 MED ORDER — METOCLOPRAMIDE HCL 5 MG/ML IJ SOLN
5.0000 mg | Freq: Three times a day (TID) | INTRAMUSCULAR | Status: DC | PRN
  Administered 2015-06-16: 5 mg via INTRAVENOUS
  Administered 2015-06-17: 10 mg via INTRAVENOUS
  Filled 2015-06-16 (×2): qty 2

## 2015-06-16 MED ORDER — CEFUROXIME SODIUM 1.5 G IJ SOLR
INTRAMUSCULAR | Status: AC
  Filled 2015-06-16: qty 1.5

## 2015-06-16 MED ORDER — LOSARTAN POTASSIUM 50 MG PO TABS
50.0000 mg | ORAL_TABLET | Freq: Every day | ORAL | Status: DC
  Administered 2015-06-17 – 2015-06-19 (×3): 50 mg via ORAL
  Filled 2015-06-16 (×3): qty 1

## 2015-06-16 MED ORDER — TRANEXAMIC ACID 1000 MG/10ML IV SOLN
1000.0000 mg | INTRAVENOUS | Status: AC
  Administered 2015-06-16: 1000 mg via INTRAVENOUS
  Filled 2015-06-16: qty 10

## 2015-06-16 MED ORDER — ROCURONIUM BROMIDE 50 MG/5ML IV SOLN
INTRAVENOUS | Status: AC
  Filled 2015-06-16: qty 1

## 2015-06-16 MED ORDER — PROMETHAZINE HCL 25 MG/ML IJ SOLN: 6.2500 mg | INTRAMUSCULAR | Status: DC | PRN

## 2015-06-16 MED ORDER — CHLORHEXIDINE GLUCONATE 4 % EX LIQD: 60.0000 mL | Freq: Once | CUTANEOUS | Status: DC

## 2015-06-16 MED ORDER — SUCCINYLCHOLINE CHLORIDE 20 MG/ML IJ SOLN
INTRAMUSCULAR | Status: AC
  Filled 2015-06-16: qty 1

## 2015-06-16 MED ORDER — METHOCARBAMOL 1000 MG/10ML IJ SOLN
500.0000 mg | Freq: Four times a day (QID) | INTRAVENOUS | Status: DC | PRN
  Filled 2015-06-16: qty 5

## 2015-06-16 MED ORDER — PHENOL 1.4 % MT LIQD: 1.0000 | OROMUCOSAL | Status: DC | PRN

## 2015-06-16 MED ORDER — ONDANSETRON HCL 4 MG PO TABS: 4.0000 mg | ORAL_TABLET | Freq: Four times a day (QID) | ORAL | Status: DC | PRN

## 2015-06-16 MED ORDER — HYDROMORPHONE HCL 1 MG/ML IJ SOLN
0.5000 mg | INTRAMUSCULAR | Status: DC | PRN
  Administered 2015-06-16: 1 mg via INTRAVENOUS
  Administered 2015-06-16: 0.5 mg via INTRAVENOUS
  Administered 2015-06-16: 1 mg via INTRAVENOUS
  Administered 2015-06-17 (×2): 0.5 mg via INTRAVENOUS
  Administered 2015-06-17: 1 mg via INTRAVENOUS
  Administered 2015-06-17: 0.5 mg via INTRAVENOUS
  Administered 2015-06-17 (×2): 1 mg via INTRAVENOUS
  Administered 2015-06-17 (×2): 0.5 mg via INTRAVENOUS
  Filled 2015-06-16 (×12): qty 1

## 2015-06-16 MED ORDER — MENTHOL 3 MG MT LOZG: 1.0000 | LOZENGE | OROMUCOSAL | Status: DC | PRN

## 2015-06-16 MED ORDER — HYDROMORPHONE HCL 1 MG/ML IJ SOLN
0.2500 mg | INTRAMUSCULAR | Status: DC | PRN
  Administered 2015-06-16 (×4): 0.5 mg via INTRAVENOUS

## 2015-06-16 MED ORDER — TIZANIDINE HCL 2 MG PO TABS: 2.0000 mg | ORAL_TABLET | Freq: Four times a day (QID) | ORAL | Status: DC | PRN

## 2015-06-16 MED ORDER — SENNOSIDES-DOCUSATE SODIUM 8.6-50 MG PO TABS: 1.0000 | ORAL_TABLET | Freq: Every evening | ORAL | Status: DC | PRN

## 2015-06-16 MED ORDER — PROPOFOL INFUSION 10 MG/ML OPTIME
INTRAVENOUS | Status: DC | PRN
  Administered 2015-06-16: 50 ug/kg/min via INTRAVENOUS

## 2015-06-16 MED ORDER — ASPIRIN EC 325 MG PO TBEC
325.0000 mg | DELAYED_RELEASE_TABLET | Freq: Every day | ORAL | Status: DC
  Administered 2015-06-17 – 2015-06-19 (×3): 325 mg via ORAL
  Filled 2015-06-16 (×4): qty 1

## 2015-06-16 MED ORDER — ACETAMINOPHEN 10 MG/ML IV SOLN
INTRAVENOUS | Status: AC
  Filled 2015-06-16: qty 100

## 2015-06-16 SURGICAL SUPPLY — 63 items
BANDAGE ELASTIC 6 VELCRO ST LF (GAUZE/BANDAGES/DRESSINGS) ×3 IMPLANT
BANDAGE ESMARK 6X9 LF (GAUZE/BANDAGES/DRESSINGS) ×1 IMPLANT
BLADE SAG 18X100X1.27 (BLADE) ×3 IMPLANT
BLADE SAW SGTL 13X75X1.27 (BLADE) ×3 IMPLANT
BNDG ELASTIC 6X10 VLCR STRL LF (GAUZE/BANDAGES/DRESSINGS) ×3 IMPLANT
BNDG ESMARK 6X9 LF (GAUZE/BANDAGES/DRESSINGS) ×3
BOWL SMART MIX CTS (DISPOSABLE) ×3 IMPLANT
CAPT KNEE TOTAL 3 ATTUNE ×3 IMPLANT
CEMENT HV SMART SET (Cement) ×6 IMPLANT
COVER SURGICAL LIGHT HANDLE (MISCELLANEOUS) ×3 IMPLANT
CUFF TOURNIQUET SINGLE 34IN LL (TOURNIQUET CUFF) ×3 IMPLANT
DRAPE EXTREMITY T 121X128X90 (DRAPE) ×3 IMPLANT
DRAPE U-SHAPE 47X51 STRL (DRAPES) ×3 IMPLANT
DURAPREP 26ML APPLICATOR (WOUND CARE) ×3 IMPLANT
ELECT REM PT RETURN 9FT ADLT (ELECTROSURGICAL) ×3
ELECTRODE REM PT RTRN 9FT ADLT (ELECTROSURGICAL) ×1 IMPLANT
EVACUATOR 1/8 PVC DRAIN (DRAIN) IMPLANT
GAUZE SPONGE 4X4 12PLY STRL (GAUZE/BANDAGES/DRESSINGS) ×6 IMPLANT
GAUZE XEROFORM 1X8 LF (GAUZE/BANDAGES/DRESSINGS) ×3 IMPLANT
GLOVE BIO SURGEON STRL SZ 6 (GLOVE) ×3 IMPLANT
GLOVE BIO SURGEON STRL SZ7.5 (GLOVE) ×3 IMPLANT
GLOVE BIO SURGEON STRL SZ8.5 (GLOVE) ×3 IMPLANT
GLOVE BIOGEL PI IND STRL 6.5 (GLOVE) ×3 IMPLANT
GLOVE BIOGEL PI IND STRL 8 (GLOVE) ×1 IMPLANT
GLOVE BIOGEL PI IND STRL 9 (GLOVE) ×1 IMPLANT
GLOVE BIOGEL PI INDICATOR 6.5 (GLOVE) ×6
GLOVE BIOGEL PI INDICATOR 8 (GLOVE) ×2
GLOVE BIOGEL PI INDICATOR 9 (GLOVE) ×2
GOWN STRL REUS W/ TWL LRG LVL3 (GOWN DISPOSABLE) ×1 IMPLANT
GOWN STRL REUS W/ TWL XL LVL3 (GOWN DISPOSABLE) ×2 IMPLANT
GOWN STRL REUS W/TWL LRG LVL3 (GOWN DISPOSABLE) ×2
GOWN STRL REUS W/TWL XL LVL3 (GOWN DISPOSABLE) ×4
HANDPIECE INTERPULSE COAX TIP (DISPOSABLE) ×2
HOOD PEEL AWAY FACE SHEILD DIS (HOOD) ×6 IMPLANT
KIT BASIN OR (CUSTOM PROCEDURE TRAY) ×3 IMPLANT
KIT ROOM TURNOVER OR (KITS) ×3 IMPLANT
MANIFOLD NEPTUNE II (INSTRUMENTS) ×3 IMPLANT
NEEDLE 22X1 1/2 (OR ONLY) (NEEDLE) ×3 IMPLANT
NEEDLE SPNL 18GX3.5 QUINCKE PK (NEEDLE) IMPLANT
NS IRRIG 1000ML POUR BTL (IV SOLUTION) ×3 IMPLANT
PACK TOTAL JOINT (CUSTOM PROCEDURE TRAY) ×3 IMPLANT
PACK UNIVERSAL I (CUSTOM PROCEDURE TRAY) ×3 IMPLANT
PAD ABD 8X10 STRL (GAUZE/BANDAGES/DRESSINGS) ×3 IMPLANT
PAD ARMBOARD 7.5X6 YLW CONV (MISCELLANEOUS) ×3 IMPLANT
PADDING CAST COTTON 6X4 STRL (CAST SUPPLIES) ×3 IMPLANT
SET HNDPC FAN SPRY TIP SCT (DISPOSABLE) ×1 IMPLANT
SPONGE GAUZE 4X4 12PLY STER LF (GAUZE/BANDAGES/DRESSINGS) ×3 IMPLANT
SUCTION FRAZIER TIP 10 FR DISP (SUCTIONS) ×3 IMPLANT
SUT VIC AB 0 CT1 27 (SUTURE) ×2
SUT VIC AB 0 CT1 27XBRD ANBCTR (SUTURE) ×1 IMPLANT
SUT VIC AB 1 CTX 36 (SUTURE) ×2
SUT VIC AB 1 CTX36XBRD ANBCTR (SUTURE) ×1 IMPLANT
SUT VIC AB 2-0 CT1 27 (SUTURE) ×2
SUT VIC AB 2-0 CT1 TAPERPNT 27 (SUTURE) ×1 IMPLANT
SUT VIC AB 3-0 CT1 27 (SUTURE) ×2
SUT VIC AB 3-0 CT1 TAPERPNT 27 (SUTURE) ×1 IMPLANT
SUT VIC AB 3-0 FS2 27 (SUTURE) ×3 IMPLANT
SYR 30ML LL (SYRINGE) ×3 IMPLANT
SYR 50ML LL SCALE MARK (SYRINGE) ×3 IMPLANT
TOWEL OR 17X24 6PK STRL BLUE (TOWEL DISPOSABLE) ×3 IMPLANT
TOWEL OR 17X26 10 PK STRL BLUE (TOWEL DISPOSABLE) ×3 IMPLANT
TRAY CATH 16FR W/PLASTIC CATH (SET/KITS/TRAYS/PACK) ×3 IMPLANT
WATER STERILE IRR 1000ML POUR (IV SOLUTION) ×9 IMPLANT

## 2015-06-16 NOTE — Progress Notes (Signed)
Utilization review completed.  

## 2015-06-16 NOTE — Progress Notes (Signed)
Orthopedic Tech Progress Note Patient Details:  Hannah Chen 1941/01/03 [id number]  CPM Left Knee CPM Left Knee: On Left Knee Flexion (Degrees): 40 Left Knee Extension (Degrees): 0 Additional Comments: trapeze bar paTIENT HELPER VIEWED ORDER FROM DOCTOR'S ORDER LIST  Hildred Priest 06/16/2015, 10:26 AM

## 2015-06-16 NOTE — Evaluation (Signed)
Physical Therapy Evaluation Patient Details Name: Hannah Chen MRN: [id number] DOB: 06-21-1941 Today's Date: 06/16/2015   History of Present Illness  Patient is a 74 y/o female s/p L TKA. PMGH includes HTN, arthritis, GERD.  Clinical Impression  Patient presents with pain and post surgical deficits LLE s/p L TKA. Pt tolerated short distance ambulation with Min guard assist for safety. Reviewed exercises and precautions. Will continue to follow acutely to maximize independence and mobility prior to return home.     Follow Up Recommendations Home health PT;Supervision/Assistance - 24 hour    Equipment Recommendations  Rolling walker with 5" wheels    Recommendations for Other Services       Precautions / Restrictions Precautions Precautions: Knee Precaution Booklet Issued: No Precaution Comments: Reviewed no pillow under knee. Restrictions Weight Bearing Restrictions: Yes LLE Weight Bearing: Weight bearing as tolerated      Mobility  Bed Mobility Overal bed mobility: Needs Assistance Bed Mobility: Supine to Sit     Supine to sit: Min guard;HOB elevated     General bed mobility comments: Min guard for safety. Cues for technique. Increased time. Nausea present.   Transfers Overall transfer level: Needs assistance Equipment used: Rolling walker (2 wheeled) Transfers: Sit to/from Stand Sit to Stand: Min guard         General transfer comment: Min guard for safety. Cues for hand placement/technique.  Ambulation/Gait Ambulation/Gait assistance: Min guard Ambulation Distance (Feet): 8 Feet Assistive device: Rolling walker (2 wheeled) Gait Pattern/deviations: Step-to pattern;Decreased stride length;Decreased stance time - left;Decreased step length - right   Gait velocity interpretation: Below normal speed for age/gender General Gait Details: Pt with slow, mildly unsteady gait. + dizziness.   Stairs            Wheelchair Mobility    Modified Rankin  (Stroke Patients Only)       Balance Overall balance assessment: Needs assistance Sitting-balance support: Feet supported;No upper extremity supported Sitting balance-Leahy Scale: Fair     Standing balance support: During functional activity Standing balance-Leahy Scale: Poor Standing balance comment: Relient on RW for support.                              Pertinent Vitals/Pain Pain Assessment: 0-10 Pain Score: 6  Pain Location: left knee Pain Descriptors / Indicators: Sore Pain Intervention(s): Monitored during session;Premedicated before session;Repositioned    Home Living Family/patient expects to be discharged to:: Private residence Living Arrangements: Spouse/significant other Available Help at Discharge: Family Type of Home: House Home Access: Stairs to enter Entrance Stairs-Rails: None Entrance Stairs-Number of Steps: 1 Home Layout: One level Home Equipment: Bedside commode      Prior Function Level of Independence: Independent         Comments: Pt goes to the [employer] and does zumba classes.      Hand Dominance        Extremity/Trunk Assessment   Upper Extremity Assessment: Defer to OT evaluation           Lower Extremity Assessment: LLE deficits/detail   LLE Deficits / Details: Limited AROM/strength secondary to surgery/pain.     Communication   Communication: No difficulties  Cognition Arousal/Alertness: Awake/alert Behavior During Therapy: WFL for tasks assessed/performed Overall Cognitive Status: Within Functional Limits for tasks assessed                      General Comments      Exercises Total  Joint Exercises Ankle Circles/Pumps: Both;10 reps;Supine Quad Sets: Both;10 reps;Supine Gluteal Sets: Both;10 reps;Supine      Assessment/Plan    PT Assessment Patient needs continued PT services  PT Diagnosis Difficulty walking;Acute pain   PT Problem List Decreased strength;Pain;Decreased range of  motion;Decreased activity tolerance;Impaired sensation;Decreased balance;Decreased mobility  PT Treatment Interventions Balance training;Gait training;Stair training;Functional mobility training;Therapeutic activities;Therapeutic exercise;Patient/family education;DME instruction   PT Goals (Current goals can be found in the Care Plan section) Acute Rehab PT Goals Patient Stated Goal: to get back to zumba and tennis PT Goal Formulation: With patient/family Time For Goal Achievement: 06/30/15 Potential to Achieve Goals: Good    Frequency 7X/week   Barriers to discharge        Co-evaluation               End of Session Equipment Utilized During Treatment: Gait belt Activity Tolerance: Patient tolerated treatment well Patient left: in chair;with call bell/phone within reach;with family/visitor present Nurse Communication: Mobility status         Time: 5686-1683 PT Time Calculation (min) (ACUTE ONLY): 23 min   Charges:   PT Evaluation $Initial PT Evaluation Tier I: 1 Procedure PT Treatments $Therapeutic Activity: 8-22 mins   PT G Codes:        Skeeter Sheard A Makar Slatter 06/16/2015, 3:12 PM Wray Kearns, [place], DPT [phone]

## 2015-06-16 NOTE — Progress Notes (Signed)
Hannah Chen is a 75 y.o. female patient admitted from ED awake, alert - oriented  X 3 - no acute distress noted.  VSS - Blood pressure 131/59, pulse 83, temperature 98.5 F (36.9 C), temperature source Oral, resp. rate 16, height 5\' 7"  (1.702 m), weight 77.111 kg (170 lb), SpO2 97 %.    IV in place, occlusive dsg intact without redness.  Orientation to room, and floor completed with information packet given to patient/family.  Patient declined safety video at this time.  Admission INP armband ID verified with patient/family, and in place.   SR up x 2, fall assessment complete, with patient and family able to verbalize understanding of risk associated with falls, and verbalized understanding to call nsg before up out of bed.  Call light within reach, patient able to voice, and demonstrate understanding.  Left knee dressing clean dry and intact.   Will cont to eval and treat per MD orders.  Janalyn Shy, RN 06/16/2015 2:38 PM

## 2015-06-16 NOTE — Transfer of Care (Signed)
Immediate Anesthesia Transfer of Care Note  Patient: Hannah Chen  Procedure(s) Performed: Procedure(s): TOTAL KNEE ARTHROPLASTY (Left)  Patient Location: PACU  Anesthesia Type:Spinal  Level of Consciousness: awake, alert , oriented and patient cooperative  Airway & Oxygen Therapy: Patient Spontanous Breathing and Patient connected to nasal cannula oxygen  Post-op Assessment: Report given to RN, Post -op Vital signs reviewed and stable and Patient moving all extremities  Post vital signs: Reviewed and stable  Last Vitals:  Filed Vitals:   06/16/15 0546  BP: 164/69  Pulse: 87  Temp: 76.1 C    Complications: No apparent anesthesia complications

## 2015-06-16 NOTE — Interval H&P Note (Signed)
History and Physical Interval Note:  06/16/2015 7:13 AM  Hannah Chen  has presented today for surgery, with the diagnosis of LEFT KNEE OSTEOARTHRITIS  The various methods of treatment have been discussed with the patient and family. After consideration of risks, benefits and other options for treatment, the patient has consented to  Procedure(s): TOTAL KNEE ARTHROPLASTY (Left) as a surgical intervention .  The patient's history has been reviewed, patient examined, no change in status, stable for surgery.  I have reviewed the patient's chart and labs.  Questions were answered to the patient's satisfaction.     Kerin Salen

## 2015-06-16 NOTE — Discharge Instructions (Signed)

## 2015-06-16 NOTE — Anesthesia Procedure Notes (Addendum)
Procedure Name: MAC Date/Time: 06/16/2015 7:31 AM Performed by: Rogers Blocker Pre-anesthesia Checklist: Patient identified, Timeout performed, Emergency Drugs available, Suction available and Patient being monitored Patient Re-evaluated:Patient Re-evaluated prior to inductionOxygen Delivery Method: Nasal cannula Intubation Type: IV induction Placement Confirmation: positive ETCO2   Spinal Patient location during procedure: OR Start time: 06/16/2015 7:28 AM End time: 06/16/2015 7:36 AM Staffing Anesthesiologist: Duane Boston Performed by: anesthesiologist  Preanesthetic Checklist Completed: patient identified, surgical consent, pre-op evaluation, timeout performed, IV checked, risks and benefits discussed and monitors and equipment checked Spinal Block Patient position: sitting Prep: Betadine Patient monitoring: cardiac monitor, continuous pulse ox and blood pressure Approach: midline Location: L2-3 Injection technique: single-shot Needle Needle type: Pencan  Needle gauge: 24 G Needle length: 9 cm Additional Notes Functioning IV was confirmed and monitors were applied. Sterile prep and drape, including hand hygiene and sterile gloves were used. The patient was positioned and the spine was prepped. The skin was anesthetized with lidocaine.  Free flow of clear CSF was obtained prior to injecting local anesthetic into the CSF.  The spinal needle aspirated freely following injection.  The needle was carefully withdrawn.  The patient tolerated the procedure well.

## 2015-06-16 NOTE — Op Note (Signed)
PATIENT ID:      Hannah Chen  MRN:     [id number] DOB/AGE:    Apr 23, 1941 / 74 y.o.       OPERATIVE REPORT    DATE OF PROCEDURE:  06/16/2015       PREOPERATIVE DIAGNOSIS:   LEFT KNEE OSTEOARTHRITIS      Estimated body mass index is 26.62 kg/(m^2) as calculated from the following:   Height as of this encounter: 5\' 7"  (1.702 m).   Weight as of this encounter: 77.111 kg (170 lb).                                                        POSTOPERATIVE DIAGNOSIS:   LEFT KNEE OSTEOARTHRITIS, VALGUS                                                                     PROCEDURE:  Procedure(s): TOTAL KNEE ARTHROPLASTY Using DepuyAttune RP implants #6L Femur, #5Tibia, 5 mm Attune RP bearing, 35 Patella     SURGEON: Libby Goehring J    ASSISTANT:   [employer]   (Present and scrubbed throughout the case, critical for assistance with exposure, retraction, instrumentation, and closure.)         ANESTHESIA: Spinal, Exparel  EBL: 300  FLUID REPLACEMENT: 1500 crystalloid  TOURNIQUET TIME: 62min  Drains: None  Tranexamic Acid: 1gm iv   COMPLICATIONS:  None         INDICATIONS FOR PROCEDURE: The patient has  LEFT KNEE OSTEOARTHRITIS, varus deformities, XR shows bone on bone arthritis. Patient has failed all conservative measures including anti-inflammatory medicines, narcotics, attempts at  exercise and weight loss, cortisone injections and viscosupplementation.  Risks and benefits of surgery have been discussed, questions answered.   DESCRIPTION OF PROCEDURE: The patient identified by armband, received  IV antibiotics, in the holding area at [facility]. Patient taken to the operating room, appropriate anesthetic  monitors were attached, and general endotracheal anesthesia induced with  the patient in supine position. Tourniquet  applied high to the operative thigh. Lateral post and foot positioner  applied to the table, the lower extremity was then prepped and draped  in usual  sterile fashion from the ankle to the tourniquet. Time-out procedure was performed. We began the operation, with the knee flexed 100 degrees, by making the anterior midline incision starting at handbreadth above the patella going over the patella 1 cm medial to and 4 cm distal to the tibial tubercle. Small bleeders in the skin and the  subcutaneous tissue identified and cauterized. Transverse retinaculum was incised and reflected medially and a medial parapatellar arthrotomy was accomplished. the patella was everted and theprepatellar fat pad resected. The superficial medial collateral  ligament was then elevated from anterior to posterior along the proximal  flare of the tibia and anterior half of the menisci resected. The knee was hyperflexed exposing bone on bone arthritis. Peripheral and notch osteophytes as well as the cruciate ligaments were then resected. We continued to  work our way around posteriorly along the proximal tibia, and externally  rotated  the tibia subluxing it out from underneath the femur. A McHale  retractor was placed through the notch and a lateral Hohmann retractor  placed, and we then drilled through the proximal tibia in line with the  axis of the tibia followed by an intramedullary guide rod and 2-degree  posterior slope cutting guide. The tibial cutting guide, 3 degree posterior sloped, was pinned into place allowing resection of 9 mm of bone medially and about 5 mm of bone laterally. Satisfied with the tibial resection, we then  entered the distal femur 2 mm anterior to the PCL origin with the  intramedullary guide rod and applied the distal femoral cutting guide  set at 40mm, with 5 degrees of valgus. This was pinned along the  epicondylar axis. At this point, the distal femoral cut was accomplished without difficulty. We then sized for a #6L femoral component and pinned the guide in 0 degrees of external rotation.The chamfer cutting guide was pinned into place. The  anterior, posterior, and chamfer cuts were accomplished without difficulty followed by  the Attune RP box cutting guide and the box cut. We also removed posterior osteophytes from the posterior femoral condyles. At this  time, the knee was brought into full extension. We checked our  extension and flexion gaps and found them symmetric for a 5 mm bearing. Distracting in extension with a lamina spreader, the posterior horns of the menisci were removed, and Exparel, diluted to 60 cc, was injected into the capsule of the knee. The  posterior patella cut was accomplished with the 9.5 mm Attune cutting guide, sized at 35* dome, and the fixation pegs drilled.The knee  was then once again hyperflexed exposing the proximal tibia. We sized for a #5 tibial base plate, applied the smokestack and the conical reamer followed by the the Delta fin keel punch. We then hammered into place the Attune RP trial femoral component, inserted a  5 mm trial bearing, trial patellar button, and took the knee through range of motion from 0-130 degrees. No thumb pressure was required for patellar  Tracking. At this point, the limb was wrapped with an Esmarch bandage and the tourniquet inflated to 350 mmHg. All trial components were removed, mating surfaces irrigated with pulse lavage, and dried with suction and sponges. A double batch of DePuy HV cement with 1500 mg of Zinacef was mixed and applied to all bony metallic mating surfaces except for the posterior condyles of the femur itself. In order, we  hammered into place the tibial tray and removed excess cement, the femoral component and removed excess cement,  The 71mm  Attune RP bearing  was inserted, and the knee brought to full extension with compression.  The patellar button was clamped into place, and excess cement  removed. While the cement cured the wound was irrigated out with normal saline solution pulse lavage. Ligament stability and patellar tracking were checked and found  to be excellent. The parapatellar arthrotomy was closed with  running #1 Vicryl suture. The subcutaneous tissue with 0 and 2-0 undyed  Vicryl suture, and the skin with running 3-0 SQ vicryl. A dressing of Xeroform,  4 x 4, dressing sponges, Webril, and Ace wrap applied. The patient  awakened, extubated, and taken to recovery room without difficulty.   Langley Flatley J 06/16/2015, 8:55 AM

## 2015-06-16 NOTE — Anesthesia Postprocedure Evaluation (Signed)
Anesthesia Post Note  Patient: Hannah Chen  Procedure(s) Performed: Procedure(s) (LRB): TOTAL KNEE ARTHROPLASTY (Left)  Anesthesia type: MAC/SAB  Patient location: PACU  Post pain: Pain level controlled  Post assessment: Patient's Cardiovascular Status Stable, SAB receding  Last Vitals:  Filed Vitals:   06/16/15 1143  BP:   Pulse: 80  Temp:   Resp: 20    Post vital signs: Reviewed and stable  Level of consciousness: sedated  Complications: No apparent anesthesia complications

## 2015-06-17 ENCOUNTER — Encounter (HOSPITAL_COMMUNITY): Payer: Self-pay | Admitting: Orthopedic Surgery

## 2015-06-17 LAB — BASIC METABOLIC PANEL
Anion gap: 10 (ref 5–15)
BUN: 9 mg/dL (ref 6–20)
CALCIUM: 9.3 mg/dL (ref 8.9–10.3)
CHLORIDE: 101 mmol/L (ref 101–111)
CO2: 23 mmol/L (ref 22–32)
Creatinine, Ser: 0.65 mg/dL (ref 0.44–1.00)
GFR calc Af Amer: >60 mL/min (ref >60)
GLUCOSE: 146 mg/dL — AB (ref 65–99)
Potassium: 4.4 mmol/L (ref 3.5–5.1)
Sodium: 134 mmol/L — ABNORMAL LOW (ref 135–145)

## 2015-06-17 LAB — CBC
HEMATOCRIT: 36.1 % (ref 36.0–46.0)
HEMOGLOBIN: 12.1 g/dL (ref 12.0–15.0)
MCH: 31 pg (ref 26.0–34.0)
MCHC: 33.5 g/dL (ref 30.0–36.0)
MCV: 92.6 fL (ref 78.0–100.0)
Platelets: 284 10*3/uL (ref 150–400)
RBC: 3.9 MIL/uL (ref 3.87–5.11)
RDW: 13.2 % (ref 11.5–15.5)
WBC: 18.9 10*3/uL — ABNORMAL HIGH (ref 4.0–10.5)

## 2015-06-17 NOTE — Progress Notes (Signed)
Physical Therapy Treatment Patient Details Name: Hannah Chen MRN: [id number] DOB: 12-19-1940 Today's Date: 06/17/2015    History of Present Illness Patient is a 74 y/o female s/p L TKA. PMGH includes HTN, arthritis, GERD.    PT Comments    Pt motivated to get better. However 2/2 pain pt is limited in her progression of walking and exercises. Pt needed frequent rest breaks when walking, 2/2 fatigue and pain. Pt also felt dizziness (suspcet due to medications). Will plan on attempting stairs next session.  Follow Up Recommendations  Home health PT;Supervision/Assistance - 24 hour     Equipment Recommendations  Rolling walker with 5" wheels    Recommendations for Other Services       Precautions / Restrictions Precautions Precautions: Knee Precaution Comments: Reviewed no pillow under knee. Restrictions Weight Bearing Restrictions: Yes LLE Weight Bearing: Weight bearing as tolerated    Mobility  Bed Mobility               General bed mobility comments: Pt found in chair.  Transfers Overall transfer level: Needs assistance Equipment used: Rolling walker (2 wheeled) Transfers: Sit to/from Stand Sit to Stand: Min guard         General transfer comment: Needed cues for hand placement. Min guard for safety.  Ambulation/Gait Ambulation/Gait assistance: Min guard Ambulation Distance (Feet): 35 Feet Assistive device: Rolling walker (2 wheeled) Gait Pattern/deviations: Step-to pattern;Decreased step length - right;Decreased stance time - left   Gait velocity interpretation: Below normal speed for age/gender General Gait Details: Pt moves slow and needs frequent short standing rest breaks 2/2 fatigue, pain, and dizziness.   Stairs            Wheelchair Mobility    Modified Rankin (Stroke Patients Only)       Balance                                    Cognition Arousal/Alertness: Awake/alert Behavior During Therapy: WFL for tasks  assessed/performed Overall Cognitive Status: Within Functional Limits for tasks assessed                      Exercises Total Joint Exercises Quad Sets: AROM;Left;10 reps;Seated Heel Slides: AROM;Left;10 reps;Seated Hip ABduction/ADduction: AROM;Left;10 reps;Seated Straight Leg Raises: AROM;Left;10 reps;Seated    General Comments        Pertinent Vitals/Pain Pain Assessment: 0-10 Pain Score: 8  Pain Location: L knee Pain Descriptors / Indicators: Sore;Throbbing Pain Intervention(s): Monitored during session;Premedicated before session;Repositioned    Home Living                      Prior Function            PT Goals (current goals can now be found in the care plan section) Progress towards PT goals: Progressing toward goals    Frequency  7X/week    PT Plan Current plan remains appropriate    Co-evaluation             End of Session Equipment Utilized During Treatment: Gait belt Activity Tolerance: Patient tolerated treatment well Patient left: in chair;with call bell/phone within reach;with family/visitor present     Time: 3419-3790 PT Time Calculation (min) (ACUTE ONLY): 32 min  Charges:                       G Codes:  Allyn Kenner, SPTA 06/17/2015, 11:27 AM

## 2015-06-17 NOTE — Progress Notes (Signed)
Patient ID: Hannah Chen, female   DOB: 1941/05/12, 74 y.o.   MRN: [id number] PATIENT ID: Hannah Chen  MRN: [id number]  DOB/AGE:  February 15, 1941 / 74 y.o.  1 Day Post-Op Procedure(s) (LRB): TOTAL KNEE ARTHROPLASTY (Left)    PROGRESS NOTE Subjective: Patient is alert, oriented, x2 Nausea, x1 Vomiting, yes passing gas, no Bowel Movement. Taking PO sips. Denies SOB, Chest or Calf Pain. Using Incentive Spirometer, PAS in place. Ambulate WBAT, CPM 0-40 Patient reports pain as 5 on 0-10 scale  .    Objective: Vital signs in last 24 hours: Filed Vitals:   06/16/15 1407 06/16/15 1954 06/17/15 0032 06/17/15 0445  BP: 131/59 138/64 140/58 150/70  Pulse: 83 80 92 100  Temp: 98.5 F (36.9 C) 98.5 F (36.9 C) 98.2 F (36.8 C) 98.1 F (36.7 C)  TempSrc:   Oral Oral  Resp: 16 20    Height:      Weight:      SpO2: 97% 99% 95% 98%      Intake/Output from previous day: I/O last 3 completed shifts: In: 5312.5 [P.O.:1520; I.V.:3637.5; IV Piggyback:155] Out: 9476 [LYYTK:3546; Emesis/NG output:3; Blood:100]   Intake/Output this shift:     LABORATORY DATA:  Recent Labs  06/16/15 2101 06/17/15 0413  WBC  --  18.9*  HGB  --  12.1  HCT  --  36.1  PLT  --  284  NA  --  134*  K  --  4.4  CL  --  101  CO2  --  23  BUN  --  9  CREATININE  --  0.65  GLUCOSE  --  146*  GLUCAP 144*  --   CALCIUM  --  9.3    Examination: Neurologically intact ABD soft Neurovascular intact Sensation intact distally Intact pulses distally Dorsiflexion/Plantar flexion intact Incision: dressing C/D/I No cellulitis present Compartment soft}  Assessment:   1 Day Post-Op Procedure(s) (LRB): TOTAL KNEE ARTHROPLASTY (Left) ADDITIONAL DIAGNOSIS: Expected Acute Blood Loss Anemia,   Plan: PT/OT WBAT, CPM 5/hrs day until ROM 0-90 degrees, then D/C CPM DVT Prophylaxis:  SCDx72hrs, ASA 325 mg BID x 2 weeks DISCHARGE PLAN: Home DISCHARGE NEEDS: HHPT, CPM, Walker and 3-in-1 comode seat     Ensley Blas  J 06/17/2015, 8:01 AM

## 2015-06-17 NOTE — Progress Notes (Signed)
Physical Therapy Treatment Patient Details Name: Hannah Chen MRN: [id number] DOB: Oct 19, 1941 Today's Date: 06/17/2015    History of Present Illness Patient is a 74 y/o female s/p L TKA. PMGH includes HTN, arthritis, GERD.    PT Comments    Pt progressing with ambulation. Was able to double walking distance compared to morning session. Pt knee tends to buckle during ambulation. Cued pt to initiate extension but stated she felt slight increase in pain. Will need to ambulate steps next session. Continue POC.  Follow Up Recommendations  Home health PT;Supervision/Assistance - 24 hour     Equipment Recommendations  Rolling walker with 5" wheels    Recommendations for Other Services       Precautions / Restrictions Precautions Precautions: Knee Precaution Comments: Reviewed no pillow under knee. Restrictions Weight Bearing Restrictions: No LLE Weight Bearing: Weight bearing as tolerated    Mobility  Bed Mobility               General bed mobility comments: Pt found in chair.  Transfers Overall transfer level: Needs assistance Equipment used: Rolling walker (2 wheeled) Transfers: Sit to/from Stand Sit to Stand: Min guard         General transfer comment: Min G for safety.  Ambulation/Gait Ambulation/Gait assistance: Min guard Ambulation Distance (Feet): 75 Feet Assistive device: Rolling walker (2 wheeled) Gait Pattern/deviations: Step-to pattern;Decreased step length - right;Decreased stance time - left   Gait velocity interpretation: Below normal speed for age/gender General Gait Details: Pt needed VC's to initiate L knee extension when in mid-stance of gait cycle. Continues to require frequent standing rest breaks during ambulation.   Stairs            Wheelchair Mobility    Modified Rankin (Stroke Patients Only)       Balance                                    Cognition Arousal/Alertness: Awake/alert Behavior During  Therapy: WFL for tasks assessed/performed Overall Cognitive Status: Within Functional Limits for tasks assessed                      Exercises Total Joint Exercises Quad Sets: AROM;Left;10 reps;Seated Heel Slides: AROM;Left;10 reps;Seated Hip ABduction/ADduction: AROM;Left;10 reps;Seated Straight Leg Raises: AROM;Left;10 reps;Seated Long Arc Quad: AROM;Left;10 reps;Seated    General Comments        Pertinent Vitals/Pain Pain Assessment: 0-10 Pain Score: 6  Pain Location: L knee Pain Descriptors / Indicators: Sore;Aching;Throbbing Pain Intervention(s): Limited activity within patient's tolerance;Repositioned    Home Living Family/patient expects to be discharged to:: Private residence Living Arrangements: Spouse/significant other Available Help at Discharge: Family Type of Home: House Home Access: Stairs to enter Entrance Stairs-Rails: None Home Layout: One level Home Equipment: Bedside commode;Adaptive equipment      Prior Function Level of Independence: Independent      Comments: Pt goes to the [facility] and does zumba classes.    PT Goals (current goals can now be found in the care plan section) Acute Rehab PT Goals Patient Stated Goal: to friends house here and then home to [place], [place] Progress towards PT goals: Progressing toward goals    Frequency  7X/week    PT Plan Current plan remains appropriate    Co-evaluation             End of Session Equipment Utilized During Treatment: Gait belt Activity Tolerance:  Patient tolerated treatment well Patient left: in chair;with call bell/phone within reach;with family/visitor present     Time: 2122-4825 PT Time Calculation (min) (ACUTE ONLY): 34 min  Charges:  $Gait Training: 8-22 mins $Therapeutic Exercise: 8-22 mins                    G Codes:      Allyn Kenner, SPTA 07/12/15, 3:09 PM

## 2015-06-17 NOTE — Progress Notes (Signed)
OT Cancellation Note  Patient Details Name: Hannah Chen MRN: [id number] DOB: [birth year]   Cancelled Treatment:    Reason Eval/Treat Not Completed: Other (comment). Pt 10/10 pain and nauseated. Pt can have more pain meds at 10:00 and nursing is checking on if it is time for more anti-nausea meds. Will try back later as schedule allows.  Almon Register [phone] 06/17/2015, 9:30 AM

## 2015-06-17 NOTE — Evaluation (Signed)
  Occupational Therapy Evaluation and Discharge Patient Details Name: Hannah Chen MRN: [id number] DOB: 07-26-1941 Today's Date: 06/17/2015    History of Present Illness Patient is a 74 y/o female s/p L TKA. PMGH includes HTN, arthritis, GERD.   Clinical Impression   This 74 yo female admitted and underwent above presents to acute OT with all education completed, we will D/C from acute OT.    Follow Up Recommendations  No OT follow up    Equipment Recommendations  None recommended by OT       Precautions / Restrictions Precautions Precautions: Knee Precaution Comments: Reviewed no pillow under knee. Restrictions Weight Bearing Restrictions: No LLE Weight Bearing: Weight bearing as tolerated              ADL                                         General ADL Comments: I educated pt and husband on the most efficient way to get dressed and how to do shower stall transfer to 3n1 as well as gave them a handout with the instructions. Husband will be with her to A prn. Offerred to have pt practice, but she politely declined feeling like she and husband could manage with educaton provided.     Vision Additional Comments: No change from baseline          Pertinent Vitals/Pain Pain Assessment: 0-10 Pain Score: 6  Pain Location: left knee Pain Descriptors / Indicators: Aching;Sore;Throbbing Pain Intervention(s): Ice applied     Hand Dominance Right   Extremity/Trunk Assessment Upper Extremity Assessment Upper Extremity Assessment: Overall WFL for tasks assessed           Communication Communication Communication: No difficulties   Cognition Arousal/Alertness: Awake/alert Behavior During Therapy: WFL for tasks assessed/performed Overall Cognitive Status: Within Functional Limits for tasks assessed                                Home Living Family/patient expects to be discharged to:: Private residence Living Arrangements:  Spouse/significant other Available Help at Discharge: Family Type of Home: House Home Access: Stairs to enter Technical brewer of Steps: 1 Entrance Stairs-Rails: None Home Layout: One level     Bathroom Shower/Tub: Walk-in shower;Door   [employer] Toilet: Standard     Home Equipment: Bedside commode;Adaptive equipment Adaptive Equipment: Sock aid        Prior Functioning/Environment Level of Independence: Independent        Comments: Pt goes to the [employer] and does zumba classes.     OT Diagnosis: Generalized weakness;Acute pain         OT Goals(Current goals can be found in the care plan section) Acute Rehab OT Goals Patient Stated Goal: to friends house here and then home to [place], [place]  OT Frequency:                End of Session    Activity Tolerance: Patient limited by pain Patient left: in chair;with call bell/phone within reach;with family/visitor present   Time: 1310-1330 OT Time Calculation (min): 20 min Charges:  OT General Charges $OT Visit: 1 Procedure OT Evaluation $Initial OT Evaluation Tier I: 1 Procedure  Almon Register [phone] 06/17/2015, 1:47 PM

## 2015-06-18 LAB — CBC
HEMATOCRIT: 33.2 % — AB (ref 36.0–46.0)
Hemoglobin: 11.2 g/dL — ABNORMAL LOW (ref 12.0–15.0)
MCH: 31.8 pg (ref 26.0–34.0)
MCHC: 33.7 g/dL (ref 30.0–36.0)
MCV: 94.3 fL (ref 78.0–100.0)
Platelets: 246 10*3/uL (ref 150–400)
RBC: 3.52 MIL/uL — ABNORMAL LOW (ref 3.87–5.11)
RDW: 13.4 % (ref 11.5–15.5)
WBC: 14.7 10*3/uL — ABNORMAL HIGH (ref 4.0–10.5)

## 2015-06-18 NOTE — Progress Notes (Signed)
Physical Therapy Treatment Patient Details Name: Hannah Chen MRN: [id number] DOB: 07-Dec-1940 Today's Date: 06/18/2015    History of Present Illness Patient is a 74 y/o female s/p L TKA. PMGH includes HTN, arthritis, GERD.    PT Comments    Pt progressing with ambulation. Is cognizant about initiating knee extension to prevent knee buckle. Was able to ambulate stairs with min A. Plans to D/C sometime tomorrow and will have help at home with husband. Continue with POC.   Follow Up Recommendations  Home health PT;Supervision/Assistance - 24 hour     Equipment Recommendations  Rolling walker with 5" wheels    Recommendations for Other Services       Precautions / Restrictions Precautions Precautions: Knee Precaution Comments: Reviewed no pillow under knee. Restrictions LLE Weight Bearing: Weight bearing as tolerated    Mobility  Bed Mobility Overal bed mobility: Needs Assistance Bed Mobility: Sit to Supine       Sit to supine: Min assist   General bed mobility comments: Bed flat. Cues for proper technique and slight min A to bring LLE onto bed.  Transfers Overall transfer level: Needs assistance Equipment used: Rolling walker (2 wheeled) Transfers: Sit to/from Stand Sit to Stand: Min guard         General transfer comment: Min G for safety.  Ambulation/Gait Ambulation/Gait assistance: Min guard Ambulation Distance (Feet): 90 Feet Assistive device: Rolling walker (2 wheeled) Gait Pattern/deviations: Step-to pattern;Decreased step length - right;Decreased stance time - left   Gait velocity interpretation: Below normal speed for age/gender General Gait Details: Pt conscious about trying to intiate knee extension to prevent knee buckle.   Stairs Stairs: Yes Stairs assistance: Min guard Stair Management: No rails;With walker;Backwards Number of Stairs: 1 General stair comments: Overall stair ambulation was decent. VC's for up with the good, down with the  bad.  Wheelchair Mobility    Modified Rankin (Stroke Patients Only)       Balance                                    Cognition Arousal/Alertness: Awake/alert Behavior During Therapy: WFL for tasks assessed/performed Overall Cognitive Status: Within Functional Limits for tasks assessed                      Exercises      General Comments        Pertinent Vitals/Pain Faces Pain Scale: Hurts little more Pain Location: L knee Pain Descriptors / Indicators: Aching;Sore Pain Intervention(s): Monitored during session;Repositioned    Home Living                      Prior Function            PT Goals (current goals can now be found in the care plan section) Progress towards PT goals: Progressing toward goals    Frequency  7X/week    PT Plan Current plan remains appropriate    Co-evaluation             End of Session Equipment Utilized During Treatment: Gait belt Activity Tolerance: Patient tolerated treatment well Patient left: in bed;with call bell/phone within reach;with family/visitor present     Time: 1345-1414 PT Time Calculation (min) (ACUTE ONLY): 29 min  Charges:  $Gait Training: 23-37 mins  G CodesAllyn Kenner, SPTA 06-25-15, 2:24 PM

## 2015-06-18 NOTE — Progress Notes (Signed)
PATIENT ID: Hannah Chen  MRN: [id number]  DOB/AGE:  1941/01/10 / 74 y.o.  2 Days Post-Op Procedure(s) (LRB): TOTAL KNEE ARTHROPLASTY (Left)    PROGRESS NOTE Subjective: Patient is alert, oriented, no Nausea, no Vomiting, yes passing gas, no Bowel Movement. Taking PO well. Denies SOB, Chest or Calf Pain. Using Incentive Spirometer, PAS in place. Ambulate WBAT, CPM 0-40 Patient reports pain as mild and moderate  .    Objective: Vital signs in last 24 hours: Filed Vitals:   06/17/15 0959 06/17/15 1409 06/17/15 2242 06/18/15 0553  BP: 136/59 138/54 119/82 135/53  Pulse: 97 93 99 96  Temp:  99.3 F (37.4 C) 99.4 F (37.4 C) 100.2 F (37.9 C)  TempSrc:   Oral Oral  Resp:  18 18 20   Height:      Weight:      SpO2:  94% 96% 97%      Intake/Output from previous day: I/O last 3 completed shifts: In: 3414.9 [P.O.:1680; I.V.:1734.9] Out: 7 [Urine:6; Emesis/NG output:1]   Intake/Output this shift:     LABORATORY DATA:  Recent Labs  06/16/15 2101 06/17/15 0413 06/18/15 0500  WBC  --  18.9* 14.7*  HGB  --  12.1 11.2*  HCT  --  36.1 33.2*  PLT  --  284 246  NA  --  134*  --   K  --  4.4  --   CL  --  101  --   CO2  --  23  --   BUN  --  9  --   CREATININE  --  0.65  --   GLUCOSE  --  146*  --   GLUCAP 144*  --   --   CALCIUM  --  9.3  --     Examination: Neurologically intact Neurovascular intact Sensation intact distally Intact pulses distally Dorsiflexion/Plantar flexion intact Incision: dressing C/D/I No cellulitis present Compartment soft}  Assessment:   2 Days Post-Op Procedure(s) (LRB): TOTAL KNEE ARTHROPLASTY (Left) ADDITIONAL DIAGNOSIS: Expected Acute Blood Loss Anemia,  Plan: PT/OT WBAT, CPM 5/hrs day until ROM 0-90 degrees, then D/C CPM DVT Prophylaxis:  SCDx72hrs, ASA 325 mg BID x 2 weeks DISCHARGE PLAN: Home DISCHARGE NEEDS: HHPT, HHRN, CPM, Walker and 3-in-1 comode seat     Hannah Chen R 06/18/2015, 8:16 AM

## 2015-06-18 NOTE — Care Management Important Message (Signed)
Important Message  Patient Details  Name: Hannah Chen MRN: [id number] Date of Birth: Oct 18, 1941   Medicare Important Message Given:  Yes-second notification given    Delorse Lek 06/18/2015, 1:07 PM

## 2015-06-18 NOTE — Progress Notes (Signed)
Physical Therapy Treatment Patient Details Name: Hannah Chen MRN: [id number] DOB: 06-May-1941 Today's Date: 06/18/2015    History of Present Illness Patient is a 74 y/o female s/p L TKA. PMGH includes HTN, arthritis, GERD.    PT Comments    Pt is motivated to progress with therapy. Has problems with extension in L knee. States she has had a contracture for the past 2 years and that it was like that before surgery. Knee tends to buckle occasionally 2/2 lack of extension. Continue with POC.  Follow Up Recommendations  Home health PT;Supervision/Assistance - 24 hour     Equipment Recommendations  Rolling walker with 5" wheels    Recommendations for Other Services       Precautions / Restrictions Precautions Precautions: Knee Precaution Comments: Reviewed no pillow under knee. Restrictions LLE Weight Bearing: Weight bearing as tolerated    Mobility  Bed Mobility               General bed mobility comments: Pt found in chair.  Transfers Overall transfer level: Needs assistance Equipment used: Rolling walker (2 wheeled) Transfers: Sit to/from Stand Sit to Stand: Min guard         General transfer comment: Min G for safety.  Ambulation/Gait Ambulation/Gait assistance: Min guard Ambulation Distance (Feet): 75 Feet Assistive device: Rolling walker (2 wheeled) Gait Pattern/deviations: Step-to pattern;Decreased step length - right;Decreased stance time - left   Gait velocity interpretation: Below normal speed for age/gender General Gait Details: Pt still lacking L knee extension and needed VC's to remind her to try to extend leg when in mid-stance of gait cycle.   Stairs            Wheelchair Mobility    Modified Rankin (Stroke Patients Only)       Balance                                    Cognition Arousal/Alertness: Awake/alert Behavior During Therapy: WFL for tasks assessed/performed Overall Cognitive Status: Within  Functional Limits for tasks assessed                      Exercises Total Joint Exercises Quad Sets: AROM;Left;10 reps;Seated Heel Slides: AROM;Left;10 reps;Seated Straight Leg Raises: AROM;Left;10 reps;Seated Long Arc Quad: AROM;Left;10 reps;Seated    General Comments        Pertinent Vitals/Pain Faces Pain Scale: Hurts little more Pain Location: L knee Pain Descriptors / Indicators: Aching;Sore Pain Intervention(s): Monitored during session;Repositioned    Home Living                      Prior Function            PT Goals (current goals can now be found in the care plan section) Progress towards PT goals: Progressing toward goals    Frequency  7X/week    PT Plan Current plan remains appropriate    Co-evaluation             End of Session Equipment Utilized During Treatment: Gait belt Activity Tolerance: Patient tolerated treatment well Patient left: in chair;with call bell/phone within reach     Time: 4132-4401 PT Time Calculation (min) (ACUTE ONLY): 37 min  Charges:                       G Codes:      Allyn Kenner,  SPTA 06/18/2015, 9:51 AM

## 2015-06-19 LAB — CBC
HCT: 33.2 % — ABNORMAL LOW (ref 36.0–46.0)
HEMOGLOBIN: 10.9 g/dL — AB (ref 12.0–15.0)
MCH: 30.9 pg (ref 26.0–34.0)
MCHC: 32.8 g/dL (ref 30.0–36.0)
MCV: 94.1 fL (ref 78.0–100.0)
PLATELETS: 259 10*3/uL (ref 150–400)
RBC: 3.53 MIL/uL — ABNORMAL LOW (ref 3.87–5.11)
RDW: 13.2 % (ref 11.5–15.5)
WBC: 12.6 10*3/uL — AB (ref 4.0–10.5)

## 2015-06-19 NOTE — Discharge Summary (Signed)
Patient ID: Hannah Chen MRN: [id number] DOB/AGE: [birth year] 74 y.o.  Admit date: 06/16/2015 Discharge date: 06/19/2015  Admission Diagnoses:  Active Problems:   Arthritis of knee   Discharge Diagnoses:  Same  Past Medical History  Diagnosis Date  . Hypertension   . GERD (gastroesophageal reflux disease)   . History of hiatal hernia   . Arthritis     Surgeries: Procedure(s): TOTAL KNEE ARTHROPLASTY on 06/16/2015   Consultants:    Discharged Condition: Improved  Hospital Course: Adilen-Cherie Pajak is an 74 y.o. female who was admitted 06/16/2015 for operative treatment of<principal problem not specified>. Patient has severe unremitting pain that affects sleep, daily activities, and work/hobbies. After pre-op clearance the patient was taken to the operating room on 06/16/2015 and underwent  Procedure(s): TOTAL KNEE ARTHROPLASTY.    Patient was given perioperative antibiotics: Anti-infectives    Start     Dose/Rate Route Frequency Ordered Stop   06/16/15 0812  cefUROXime (ZINACEF) injection  Status:  Discontinued       As needed 06/16/15 0812 06/16/15 0933   06/16/15 0553  ceFAZolin (ANCEF) IVPB 2 g/50 mL premix     2 g 100 mL/hr over 30 Minutes Intravenous On call to O.R. 06/16/15 1324 06/16/15 0734       Patient was given sequential compression devices, early ambulation, and chemoprophylaxis to prevent DVT.  Patient benefited maximally from hospital stay and there were no complications.    Recent vital signs: Patient Vitals for the past 24 hrs:  BP Temp Temp src Pulse Resp SpO2  06/19/15 0415 (!) 142/66 mmHg 98.8 F (37.1 C) - 97 18 93 %  06/18/15 2042 (!) 127/49 mmHg 99 F (37.2 C) - 93 18 93 %  06/18/15 1517 (!) 154/56 mmHg 97.1 F (36.2 C) Oral (!) 51 18 98 %  06/18/15 1446 (!) 123/57 mmHg 97.1 F (36.2 C) Oral 94 18 96 %     Recent laboratory studies:  Recent Labs  06/17/15 0413 06/18/15 0500 06/19/15 0353  WBC 18.9* 14.7* 12.6*  HGB 12.1 11.2* 10.9*   HCT 36.1 33.2* 33.2*  PLT 284 246 259  NA 134*  --   --   K 4.4  --   --   CL 101  --   --   CO2 23  --   --   BUN 9  --   --   CREATININE 0.65  --   --   GLUCOSE 146*  --   --   CALCIUM 9.3  --   --      Discharge Medications:     Medication List    TAKE these medications        amLODipine 10 MG tablet  Commonly known as:  NORVASC  Take 10 mg by mouth every morning.     aspirin EC 325 MG tablet  Take 1 tablet (325 mg total) by mouth 2 (two) times daily.     losartan 50 MG tablet  Commonly known as:  COZAAR  Take 50 mg by mouth daily.     oxyCODONE-acetaminophen 5-325 MG per tablet  Commonly known as:  ROXICET  Take 1 tablet by mouth every 4 (four) hours as needed.     tiZANidine 2 MG tablet  Commonly known as:  ZANAFLEX  Take 1 tablet (2 mg total) by mouth every 6 (six) hours as needed for muscle spasms.        Diagnostic Studies: Dg Chest 2 View  06/05/2015   CLINICAL DATA:  Preoperative exam prior to total knee arthroplasty, history of hypertension and hiatal hernia, nonsmoker.  EXAM: CHEST  2 VIEW  COMPARISON:  None.  FINDINGS: The lungs are mildly hyperinflated but clear. The heart and pulmonary vascularity are normal. The mediastinum is normal in width. There is no pleural effusion. The bony thorax is unremarkable.  IMPRESSION: Hyperinflation may be voluntary or could reflect underlying COPD. There is no active cardiopulmonary disease.   Electronically Signed   By: David  Martinique M.D.   On: 06/05/2015 14:32    Disposition: Final discharge disposition not confirmed      Discharge Instructions    CPM    Complete by:  As directed   Continuous passive motion machine (CPM):      Use the CPM from 0 to 50 for 5 hours per day.      You may increase by 10 per day.  You may break it up into 2 or 3 sessions per day.      Use CPM for 2 weeks or until you are told to stop.     Call MD / Call 911    Complete by:  As directed   If you experience chest pain or shortness  of breath, CALL 911 and be transported to the hospital emergency room.  If you develope a fever above 101 F, pus (white drainage) or increased drainage or redness at the wound, or calf pain, call your surgeon's office.     Change dressing    Complete by:  As directed   PRN     Constipation Prevention    Complete by:  As directed   Drink plenty of fluids.  Prune juice may be helpful.  You may use a stool softener, such as Colace (over the counter) 100 mg twice a day.  Use MiraLax (over the counter) for constipation as needed.     Diet - low sodium heart healthy    Complete by:  As directed      Do not put a pillow under the knee. Place it under the heel.    Complete by:  As directed      Increase activity slowly as tolerated    Complete by:  As directed            Follow-up Information    Follow up with Kerin Salen, MD In 2 weeks.   Specialty:  Orthopedic Surgery   Contact information:   [place] [address] [phone]       Follow up with [facility].   Why:  Someone from [facility] will contact you concerning start date and time for therapy.   Contact information:   [address] [place] [address] [phone]        Signed: Kerin Salen 06/19/2015, 9:46 AM

## 2015-06-19 NOTE — Progress Notes (Signed)
Patient ID: Hannah Chen, female   DOB: 04/10/1941, 74 y.o.   MRN: [id number] PATIENT ID: Hannah Chen  MRN: [id number]  DOB/AGE:  [birth year] / 74 y.o.  3 Days Post-Op Procedure(s) (LRB): TOTAL KNEE ARTHROPLASTY (Left)    PROGRESS NOTE Subjective: Patient is alert, oriented, no Nausea, no Vomiting, yes passing gas, no Bowel Movement. Taking PO well. Denies SOB, Chest or Calf Pain. Using Incentive Spirometer, PAS in place. Ambulate WBAT 57ft, CPM 0-40 Patient reports pain as 5 on 0-10 scale  .    Objective: Vital signs in last 24 hours: Filed Vitals:   06/18/15 1446 06/18/15 1517 06/18/15 2042 06/19/15 0415  BP: 123/57 154/56 127/49 142/66  Pulse: 94 51 93 97  Temp: 97.1 F (36.2 C) 97.1 F (36.2 C) 99 F (37.2 C) 98.8 F (37.1 C)  TempSrc: Oral Oral    Resp: 18 18 18 18   Height:      Weight:      SpO2: 96% 98% 93% 93%      Intake/Output from previous day: I/O last 3 completed shifts: In: 88 [P.O.:490] Out: 4 [Urine:4]   Intake/Output this shift: Total I/O In: 240 [P.O.:240] Out: 1 [Urine:1]   LABORATORY DATA:  Recent Labs  06/16/15 2101  06/17/15 0413 06/18/15 0500 06/19/15 0353  WBC  --   < > 18.9* 14.7* 12.6*  HGB  --   < > 12.1 11.2* 10.9*  HCT  --   < > 36.1 33.2* 33.2*  PLT  --   < > 284 246 259  NA  --   --  134*  --   --   K  --   --  4.4  --   --   CL  --   --  101  --   --   CO2  --   --  23  --   --   BUN  --   --  9  --   --   CREATININE  --   --  0.65  --   --   GLUCOSE  --   --  146*  --   --   GLUCAP 144*  --   --   --   --   CALCIUM  --   --  9.3  --   --   < > = values in this interval not displayed.  Examination: Neurologically intact ABD soft Neurovascular intact Sensation intact distally Intact pulses distally Dorsiflexion/Plantar flexion intact Incision: dressing C/D/I No cellulitis present Compartment soft}  Assessment:   3 Days Post-Op Procedure(s) (LRB): TOTAL KNEE ARTHROPLASTY (Left) ADDITIONAL DIAGNOSIS: Expected  Acute Blood Loss Anemia,   Plan: PT/OT WBAT, CPM 5/hrs day until ROM 0-90 degrees, then D/C CPM DVT Prophylaxis:  SCDx72hrs, ASA 325 mg BID x 2 weeks DISCHARGE PLAN: Home DISCHARGE NEEDS: HHPT, CPM, Walker and 3-in-1 comode seat     Hannah Chen 06/19/2015, 9:41 AM

## 2015-06-19 NOTE — Progress Notes (Signed)
Physical Therapy Treatment Patient Details Name: Hannah Chen MRN: [id number] DOB: 25-Dec-1940 Today's Date: 06/19/2015    History of Present Illness Patient is a 74 y/o female s/p L TKA. PMGH includes HTN, arthritis, GERD.    PT Comments    Pt progressing with ambulation. Has implemented a step-through pattern into gait cycle. Increased time because pt stated she was used to step-to pattern. Husband was instructed on assisting her with stairs and asked to perform them with her and was successful. Plan to D/C sometime this morning and continue to recommend HHPT for ongoing therapy to increase functional independence.  Follow Up Recommendations  Home health PT;Supervision/Assistance - 24 hour     Equipment Recommendations  Rolling walker with 5" wheels    Recommendations for Other Services       Precautions / Restrictions Precautions Precautions: Knee Precaution Comments: Reviewed no pillow under knee. Restrictions Weight Bearing Restrictions: Yes LLE Weight Bearing: Weight bearing as tolerated    Mobility  Bed Mobility               General bed mobility comments: Pt found in chair and returned to chair after session.  Transfers Overall transfer level: Needs assistance Equipment used: Rolling walker (2 wheeled) Transfers: Sit to/from Stand Sit to Stand: Supervision         General transfer comment: Supervision for safety.  Ambulation/Gait Ambulation/Gait assistance: Min guard Ambulation Distance (Feet): 100 Feet Assistive device: Rolling walker (2 wheeled) Gait Pattern/deviations: Step-to pattern;Step-through pattern;Decreased step length - right;Decreased stance time - left   Gait velocity interpretation: Below normal speed for age/gender General Gait Details: Pt conscious about trying to intiate knee extension to prevent knee buckle. Increased time 2/2 pain.   Stairs Stairs: Yes Stairs assistance: Min guard Stair Management: No rails;With  walker;Backwards Number of Stairs: 2 General stair comments: Min G for safety.  Wheelchair Mobility    Modified Rankin (Stroke Patients Only)       Balance                                    Cognition Arousal/Alertness: Awake/alert Behavior During Therapy: WFL for tasks assessed/performed Overall Cognitive Status: Within Functional Limits for tasks assessed                      Exercises Total Joint Exercises Quad Sets: AROM;Left;10 reps;Seated Heel Slides: AROM;Left;10 reps;Seated Hip ABduction/ADduction: AROM;Left;10 reps;Seated Straight Leg Raises: AROM;Left;10 reps;Seated Long Arc Quad: AROM;Left;10 reps;Seated    General Comments        Pertinent Vitals/Pain Faces Pain Scale: Hurts even more Pain Location: L knee Pain Descriptors / Indicators: Sore;Tightness;Throbbing Pain Intervention(s): Monitored during session;Repositioned    Home Living                      Prior Function            PT Goals (current goals can now be found in the care plan section) Progress towards PT goals: Progressing toward goals    Frequency  7X/week    PT Plan Current plan remains appropriate    Co-evaluation             End of Session Equipment Utilized During Treatment: Gait belt Activity Tolerance: Patient tolerated treatment well Patient left: with call bell/phone within reach;with family/visitor present;in chair     Time: 5449-2010 PT Time Calculation (min) (ACUTE ONLY): 46  min  Charges:                       G CodesAllyn Kenner, SPTA July 15, 2015, 11:08 AM

## 2018-03-14 ENCOUNTER — Other Ambulatory Visit: Payer: Self-pay | Admitting: Orthopedic Surgery

## 2018-04-07 ENCOUNTER — Other Ambulatory Visit (HOSPITAL_COMMUNITY): Payer: Self-pay | Admitting: Emergency Medicine

## 2018-04-07 NOTE — Patient Instructions (Addendum)
Hannah Chen  04/07/2018   Your procedure is scheduled on: 04-17-18   Report to [facility]    Report to admitting at 7:00AM    Call this number if you have problems the morning of surgery [phone]     Remember: Do not eat food or drink liquids :After Midnight.     Take these medicines the morning of surgery with A SIP OF WATER: amlodipine, zyrtec if needed, oemprazole                               You may not have any metal on your body including hair pins and              piercings  Do not wear jewelry, make-up, lotions, powders or perfumes, deodorant             Do not wear nail polish.  Do not shave  48 hours prior to surgery.       Do not bring valuables to the Chen. Hannah Chen.  Contacts, dentures or bridgework may not be worn into surgery.  Leave suitcase in the car. After surgery it may be brought to your room.                  Please read over the following fact sheets you were given: _____________________________________________________________________             Hannah Chen - Preparing for Surgery Before surgery, you can play an important role.  Because skin is not sterile, your skin needs to be as free of germs as possible.  You can reduce the number of germs on your skin by washing with Hannah Chen (chlorahexidine gluconate) soap before surgery.  Hannah Chen is an antiseptic cleaner which kills germs and bonds with the skin to continue killing germs even after washing. Please DO NOT use if you have an allergy to Hannah Chen or antibacterial soaps.  If your skin becomes reddened/irritated stop using the Hannah Chen and inform your nurse when you arrive at Short Stay. Do not shave (including legs and underarms) for at least 48 hours prior to the first Hannah Chen shower.  You may shave your face/neck. Please follow these instructions carefully:  1.  Shower with Hannah Chen Soap the night before surgery and the  morning of  Surgery.  2.  If you choose to wash your hair, wash your hair first as usual with your  normal  shampoo.  3.  After you shampoo, rinse your hair and body thoroughly to remove the  shampoo.                           4.  Use Hannah Chen as you would any other liquid soap.  You can apply Hannah Chen directly  to the skin and wash                       Gently with a scrungie or clean washcloth.  5.  Apply the Hannah Chen Soap to your body ONLY FROM THE NECK DOWN.   Do not use on face/ open  Wound or open sores. Avoid contact with eyes, ears mouth and genitals (private parts).                       Wash face,  Genitals (private parts) with your normal soap.             6.  Wash thoroughly, paying special attention to the area where your surgery  will be performed.  7.  Thoroughly rinse your body with warm water from the neck down.  8.  DO NOT shower/wash with your normal soap after using and rinsing off  the Hannah Chen Soap.                9.  Pat yourself dry with a clean towel.            10.  Wear clean pajamas.            11.  Place clean sheets on your bed the night of your first shower and do not  sleep with pets. Day of Surgery : Do not apply any lotions/deodorants the morning of surgery.  Please wear clean clothes to the Chen/surgery center.  FAILURE TO FOLLOW THESE INSTRUCTIONS MAY RESULT IN THE CANCELLATION OF YOUR SURGERY PATIENT SIGNATURE_________________________________  NURSE SIGNATURE__________________________________  ________________________________________________________________________   Hannah Chen  An incentive spirometer is a tool that can help keep your lungs clear and active. This tool measures how well you are filling your lungs with each breath. Taking long deep breaths may help reverse or decrease the chance of developing breathing (pulmonary) problems (especially infection) following:  A long period of time when you are unable to move or be active. BEFORE  THE PROCEDURE   If the spirometer includes an indicator to show your best effort, your nurse or respiratory therapist will set it to a desired goal.  If possible, sit up straight or lean slightly forward. Try not to slouch.  Hold the incentive spirometer in an upright position. INSTRUCTIONS FOR USE  1. Sit on the edge of your bed if possible, or sit up as far as you can in bed or on a chair. 2. Hold the incentive spirometer in an upright position. 3. Breathe out normally. 4. Place the mouthpiece in your mouth and seal your lips tightly around it. 5. Breathe in slowly and as deeply as possible, raising the piston or the ball toward the top of the column. 6. Hold your breath for 3-5 seconds or for as long as possible. Allow the piston or ball to fall to the bottom of the column. 7. Remove the mouthpiece from your mouth and breathe out normally. 8. Rest for a few seconds and repeat Steps 1 through 7 at least 10 times every 1-2 hours when you are awake. Take your time and take a few normal breaths between deep breaths. 9. The spirometer may include an indicator to show your best effort. Use the indicator as a goal to work toward during each repetition. 10. After each set of 10 deep breaths, practice coughing to be sure your lungs are clear. If you have an incision (the cut made at the time of surgery), support your incision when coughing by placing a pillow or rolled up towels firmly against it. Once you are able to get out of bed, walk around indoors and cough well. You may stop using the incentive spirometer when instructed by your caregiver.  RISKS AND COMPLICATIONS  Take your time so you do not get  dizzy or light-headed.  If you are in pain, you may need to take or ask for pain medication before doing incentive spirometry. It is harder to take a deep breath if you are having pain. AFTER USE  Rest and breathe slowly and easily.  It can be helpful to keep track of a log of your progress.  Your caregiver can provide you with a simple table to help with this. If you are using the spirometer at home, follow these instructions: Hannah Chen IF:   You are having difficultly using the spirometer.  You have trouble using the spirometer as often as instructed.  Your pain medication is not giving enough relief while using the spirometer.  You develop fever of 100.5 F (38.1 C) or higher. SEEK IMMEDIATE MEDICAL CARE IF:   You cough up bloody sputum that had not been present before.  You develop fever of 102 F (38.9 C) or greater.  You develop worsening pain at or near the incision site. MAKE SURE YOU:   Understand these instructions.  Will watch your condition.  Will get help right away if you are not doing well or get worse. Document Released: 03/28/2007 Document Revised: 02/07/2012 Document Reviewed: 05/29/2007 [facility] Patient Information 2014 [place], [place].   ________________________________________________________________________

## 2018-04-10 ENCOUNTER — Other Ambulatory Visit (HOSPITAL_COMMUNITY): Payer: Medicare Other

## 2018-04-10 ENCOUNTER — Other Ambulatory Visit: Payer: Self-pay

## 2018-04-10 ENCOUNTER — Encounter (HOSPITAL_COMMUNITY)
Admission: RE | Admit: 2018-04-10 | Discharge: 2018-04-10 | Disposition: A | Discharge status: Home / Self Care | Payer: Medicare Other | Source: Ambulatory Visit | Attending: Orthopedic Surgery | Admitting: Orthopedic Surgery

## 2018-04-10 ENCOUNTER — Ambulatory Visit (HOSPITAL_COMMUNITY)
Admission: RE | Admit: 2018-04-10 | Discharge: 2018-04-10 | Disposition: A | Discharge status: Home / Self Care | Payer: Medicare Other | Source: Ambulatory Visit | Attending: Orthopedic Surgery | Admitting: Orthopedic Surgery

## 2018-04-10 ENCOUNTER — Encounter (HOSPITAL_COMMUNITY): Payer: Self-pay

## 2018-04-10 DIAGNOSIS — Z01818 Encounter for other preprocedural examination: Secondary | ICD-10-CM | POA: Insufficient documentation

## 2018-04-10 DIAGNOSIS — Z01812 Encounter for preprocedural laboratory examination: Secondary | ICD-10-CM | POA: Diagnosis not present

## 2018-04-10 HISTORY — DX: Other specified disorders of bone density and structure, unspecified site: M85.80

## 2018-04-10 HISTORY — DX: Rash and other nonspecific skin eruption: R21

## 2018-04-10 HISTORY — DX: Hypercalcemia: E83.52

## 2018-04-10 HISTORY — DX: Malignant (primary) neoplasm, unspecified: C80.1

## 2018-04-10 HISTORY — DX: Hyperparathyroidism, unspecified: E21.3

## 2018-04-10 LAB — CBC WITH DIFFERENTIAL/PLATELET
Basophils Absolute: 0 10*3/uL (ref 0.0–0.1)
Basophils Relative: 1 %
EOS ABS: 0.4 10*3/uL (ref 0.0–0.7)
Eosinophils Relative: 4 %
HEMATOCRIT: 42.6 % (ref 36.0–46.0)
Hemoglobin: 14.4 g/dL (ref 12.0–15.0)
LYMPHS ABS: 2 10*3/uL (ref 0.7–4.0)
Lymphocytes Relative: 24 %
MCH: 31.6 pg (ref 26.0–34.0)
MCHC: 33.8 g/dL (ref 30.0–36.0)
MCV: 93.6 fL (ref 78.0–100.0)
Monocytes Absolute: 0.6 10*3/uL (ref 0.1–1.0)
Monocytes Relative: 7 %
NEUTROS ABS: 5.5 10*3/uL (ref 1.7–7.7)
NEUTROS PCT: 64 %
Platelets: 295 10*3/uL (ref 150–400)
RBC: 4.55 MIL/uL (ref 3.87–5.11)
RDW: 13.5 % (ref 11.5–15.5)
WBC: 8.5 10*3/uL (ref 4.0–10.5)

## 2018-04-10 LAB — SURGICAL PCR SCREEN
MRSA, PCR: NEGATIVE
STAPHYLOCOCCUS AUREUS: NEGATIVE

## 2018-04-10 LAB — PROTIME-INR
INR: 0.96
PROTHROMBIN TIME: 12.7 s (ref 11.4–15.2)

## 2018-04-10 LAB — URINALYSIS, ROUTINE W REFLEX MICROSCOPIC
BACTERIA UA: NONE SEEN
Bilirubin Urine: NEGATIVE
Glucose, UA: NEGATIVE mg/dL
Hgb urine dipstick: NEGATIVE
Ketones, ur: NEGATIVE mg/dL
NITRITE: NEGATIVE
PH: 7 (ref 5.0–8.0)
Protein, ur: NEGATIVE mg/dL
Specific Gravity, Urine: 1.003 — ABNORMAL LOW (ref 1.005–1.030)

## 2018-04-10 LAB — BASIC METABOLIC PANEL
Anion gap: 14 (ref 5–15)
BUN: 12 mg/dL (ref 6–20)
CHLORIDE: 106 mmol/L (ref 101–111)
CO2: 22 mmol/L (ref 22–32)
CREATININE: 0.67 mg/dL (ref 0.44–1.00)
Calcium: 10 mg/dL (ref 8.9–10.3)
GFR calc non Af Amer: >60 mL/min (ref >60)
Glucose, Bld: 101 mg/dL — ABNORMAL HIGH (ref 65–99)
POTASSIUM: 4.2 mmol/L (ref 3.5–5.1)
SODIUM: 142 mmol/L (ref 135–145)

## 2018-04-10 LAB — APTT: aPTT: 30 seconds (ref 24–36)

## 2018-04-10 LAB — ABO/RH: ABO/RH(D): A POS

## 2018-04-12 ENCOUNTER — Other Ambulatory Visit: Payer: Self-pay | Admitting: Orthopedic Surgery

## 2018-04-12 NOTE — Care Plan (Signed)
Patient resides in [place] but will discharge to local address with Husband and HHPT from [place] at home. SHe has all needed equipment at home. She is set to see Dr. Mayer Camel in the office on 04/28/18 @ 1000. Her OPPT need will be determined at that time.   Please contact Ladell Heads, [place] with any questions or if this plan needs to change.   Thanks

## 2018-04-14 DIAGNOSIS — M1611 Unilateral primary osteoarthritis, right hip: Secondary | ICD-10-CM | POA: Diagnosis present

## 2018-04-14 NOTE — H&P (Signed)
TOTAL HIP ADMISSION H&P  Patient is admitted for right total hip arthroplasty.  Subjective:  Chief Complaint: right hip pain  HPI: Hannah Chen, 77 y.o. female, has a history of pain and functional disability in the right hip(s) due to arthritis and patient has failed non-surgical conservative treatments for greater than 12 weeks to include NSAID's and/or analgesics, corticosteriod injections, flexibility and strengthening excercises, weight reduction as appropriate and activity modification.  Onset of symptoms was gradual starting 3 years ago with gradually worsening course since that time.The patient noted no past surgery on the right hip(s).  Patient currently rates pain in the right hip at 10 out of 10 with activity. Patient has night pain, worsening of pain with activity and weight bearing, pain that interfers with activities of daily living and pain with passive range of motion. Patient has evidence of joint space narrowing by imaging studies. This condition presents safety issues increasing the risk of falls.  There is no current active infection.  Patient Active Problem List   Diagnosis Date Noted  . Arthritis of knee 06/16/2015   Past Medical History:  Diagnosis Date  . Arthritis    oa  . Cancer (HCC)    squamous cell  . GERD (gastroesophageal reflux disease)   . High calcium levels    dec 2018  . History of hiatal hernia   . Hyperparathyroidism ([place])    mild, will have 1 lobe taken out in fall   . Hypertension   . Osteopenia   . Rash    small red rash upper arms healing    Past Surgical History:  Procedure Laterality Date  . ABDOMINAL HYSTERECTOMY  1984   complete  . ANKLE SURGERY Left 2012  . APPENDECTOMY  1984  . CATARACT EXTRACTION Bilateral 11/2014  . LAMINECTOMY  2001  . left knee arthroscopy  02/2004  . 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 open shoulder surgery  1987  . TONSILLECTOMY  77 year old  . TOTAL KNEE ARTHROPLASTY  Left 06/16/2015   Procedure: TOTAL KNEE ARTHROPLASTY;  Surgeon: Frederik Pear, MD;  Location: [facility];  Service: Orthopedics;  Laterality: Left;    No current facility-administered medications for this encounter.    Current Outpatient Medications  Medication Sig Dispense Refill Last Dose  . acetaminophen (TYLENOL) 500 MG tablet Take 500 mg by mouth every 6 (six) hours as needed for moderate pain.   04/09/2018 at Unknown time  . amLODipine (NORVASC) 10 MG tablet Take 10 mg by mouth every morning.  2 04/10/2018 at Unknown time  . cetirizine (ZYRTEC) 10 MG tablet Take 10 mg by mouth daily as needed for allergies.   unknown  . Coenzyme Q10 (CO Q-10 PO) Take 1 capsule by mouth daily.   Past Week at Unknown time  . losartan (COZAAR) 50 MG tablet Take 50 mg by mouth daily.  2 04/10/2018 at Unknown time  . Multiple Vitamins-Minerals (MULTIVITAMIN ADULT) TABS Take 1 tablet by mouth daily.   04/07/2018 at unknown time  . omeprazole (PRILOSEC) 20 MG capsule Take 20 mg by mouth daily.   Past Week at Unknown time  . ranitidine (ZANTAC) 150 MG tablet Take 150 mg by mouth every evening.   Past Month at Unknown time  . TURMERIC PO Take 1 tablet by mouth daily as needed (pain).   Past Month at Unknown time  . aspirin EC 325 MG tablet Take 1 tablet (  325 mg total) by mouth 2 (two) times daily. (Patient not taking: Reported on 04/10/2018) 30 tablet 0 Completed Course at Unknown time  . oxyCODONE-acetaminophen (ROXICET) 5-325 MG per tablet Take 1 tablet by mouth every 4 (four) hours as needed. (Patient not taking: Reported on 04/10/2018) 60 tablet 0 Completed Course at Unknown time  . tiZANidine (ZANAFLEX) 2 MG tablet Take 1 tablet (2 mg total) by mouth every 6 (six) hours as needed for muscle spasms. (Patient not taking: Reported on 04/10/2018) 60 tablet 0 Completed Course at Unknown time   Allergies  Allergen Reactions  . Adhesive [Tape]     Red mark, paper tape ok  . Hydrocodone-Acetaminophen Nausea And Vomiting  . Latex      Rash and itching  . Neosporin [Neomycin-Bacitracin Zn-Polymyx]     Bacitracin=rash  . Sulfa Antibiotics Hives    Social History   Tobacco Use  . Smoking status: Never Smoker  . Smokeless tobacco: Never Used  Substance Use Topics  . Alcohol use: Yes    Comment: social    No family history on file.   Review of Systems  Constitutional: Negative.   HENT: Negative.   Eyes: Negative.   Cardiovascular: Positive for leg swelling.       HTN  Gastrointestinal: Negative.   Genitourinary: Positive for urgency.  Musculoskeletal: Positive for joint pain.  Skin: Positive for rash.  Neurological: Negative.   Endo/Heme/Allergies: Negative.   Psychiatric/Behavioral: Negative.     Objective:  Physical Exam  Constitutional: She is oriented to person, place, and time. She appears well-developed and well-nourished.  HENT:  Head: Normocephalic and atraumatic.  Eyes: Pupils are equal, round, and reactive to light.  Neck: Normal range of motion. Neck supple.  Cardiovascular: Intact distal pulses.  Respiratory: Effort normal.  Musculoskeletal:  Patient has irritability to internal rotation of the right hip in the seated position and especially supine with her hip flexed 45, internal rotation definitely reproduces her pain.    Neurological: She is alert and oriented to person, place, and time.  Skin: Skin is warm and dry.  Psychiatric: She has a normal mood and affect. Her behavior is normal. Judgment and thought content normal.    Vital signs in last 24 hours:    Labs:   Estimated body mass index is 27.53 kg/m as calculated from the following:   Height as of 04/10/18: 5\' 7"  (1.702 m).   Weight as of 04/10/18: 79.7 kg (175 lb 12.8 oz).   Imaging Review Plain radiographs demonstrate bone-on-bone medial pole arthritis of the right hip concordant with her pain.     Preoperative templating of the joint replacement has been completed, documented, and submitted to the Operating Room  personnel in order to optimize intra-operative equipment management.   Assessment/Plan:  End stage arthritis, right hip(s)  The patient history, physical examination, clinical judgement of the provider and imaging studies are consistent with end stage degenerative joint disease of the right hip(s) and total hip arthroplasty is deemed medically necessary. The treatment options including medical management, injection therapy, arthroscopy and arthroplasty were discussed at length. The risks and benefits of total hip arthroplasty were presented and reviewed. The risks due to aseptic loosening, infection, stiffness, dislocation/subluxation,  thromboembolic complications and other imponderables were discussed.  The patient acknowledged the explanation, agreed to proceed with the plan and consent was signed. Patient is being admitted for inpatient treatment for surgery, pain control, PT, OT, prophylactic antibiotics, VTE prophylaxis, progressive ambulation and ADL's and discharge planning.The  patient is planning to be discharged home with home health services.

## 2018-04-16 MED ORDER — BUPIVACAINE LIPOSOME 1.3 % IJ SUSP
20.0000 mL | INTRAMUSCULAR | Status: DC
  Filled 2018-04-16: qty 20

## 2018-04-16 MED ORDER — TRANEXAMIC ACID 1000 MG/10ML IV SOLN
1000.0000 mg | INTRAVENOUS | Status: AC
  Administered 2018-04-17: 1000 mg via INTRAVENOUS
  Filled 2018-04-16: qty 1100

## 2018-04-16 MED ORDER — SODIUM CHLORIDE 0.9 % IV SOLN
2000.0000 mg | INTRAVENOUS | Status: DC
  Filled 2018-04-16: qty 20

## 2018-04-17 ENCOUNTER — Inpatient Hospital Stay (HOSPITAL_COMMUNITY): Payer: Medicare Other | Admitting: Registered Nurse

## 2018-04-17 ENCOUNTER — Other Ambulatory Visit: Payer: Self-pay

## 2018-04-17 ENCOUNTER — Inpatient Hospital Stay (HOSPITAL_COMMUNITY): Payer: Medicare Other

## 2018-04-17 ENCOUNTER — Encounter (HOSPITAL_COMMUNITY): Payer: Self-pay | Admitting: *Deleted

## 2018-04-17 ENCOUNTER — Inpatient Hospital Stay (HOSPITAL_COMMUNITY)
Admission: RE | Admit: 2018-04-17 | Discharge: 2018-04-19 | DRG: 470 | Disposition: A | Discharge status: Home Health | Payer: Medicare Other | Source: Ambulatory Visit | Attending: Orthopedic Surgery | Admitting: Orthopedic Surgery

## 2018-04-17 ENCOUNTER — Encounter (HOSPITAL_COMMUNITY)
Admission: RE | Disposition: A | Discharge status: Home Health | Payer: Self-pay | Source: Ambulatory Visit | Attending: Orthopedic Surgery

## 2018-04-17 DIAGNOSIS — Z419 Encounter for procedure for purposes other than remedying health state, unspecified: Secondary | ICD-10-CM

## 2018-04-17 DIAGNOSIS — M1611 Unilateral primary osteoarthritis, right hip: Secondary | ICD-10-CM | POA: Diagnosis present

## 2018-04-17 DIAGNOSIS — I10 Essential (primary) hypertension: Secondary | ICD-10-CM | POA: Diagnosis present

## 2018-04-17 DIAGNOSIS — Z881 Allergy status to other antibiotic agents status: Secondary | ICD-10-CM

## 2018-04-17 DIAGNOSIS — D62 Acute posthemorrhagic anemia: Secondary | ICD-10-CM | POA: Diagnosis present

## 2018-04-17 DIAGNOSIS — Z96652 Presence of left artificial knee joint: Secondary | ICD-10-CM | POA: Diagnosis present

## 2018-04-17 DIAGNOSIS — Z886 Allergy status to analgesic agent status: Secondary | ICD-10-CM

## 2018-04-17 DIAGNOSIS — K449 Diaphragmatic hernia without obstruction or gangrene: Secondary | ICD-10-CM | POA: Diagnosis present

## 2018-04-17 DIAGNOSIS — Z79899 Other long term (current) drug therapy: Secondary | ICD-10-CM | POA: Diagnosis not present

## 2018-04-17 DIAGNOSIS — K219 Gastro-esophageal reflux disease without esophagitis: Secondary | ICD-10-CM | POA: Diagnosis present

## 2018-04-17 DIAGNOSIS — Z9181 History of falling: Secondary | ICD-10-CM | POA: Diagnosis not present

## 2018-04-17 DIAGNOSIS — Z882 Allergy status to sulfonamides status: Secondary | ICD-10-CM

## 2018-04-17 DIAGNOSIS — Z91048 Other nonmedicinal substance allergy status: Secondary | ICD-10-CM

## 2018-04-17 DIAGNOSIS — M8580 Other specified disorders of bone density and structure, unspecified site: Secondary | ICD-10-CM | POA: Diagnosis present

## 2018-04-17 DIAGNOSIS — Z885 Allergy status to narcotic agent status: Secondary | ICD-10-CM | POA: Diagnosis not present

## 2018-04-17 HISTORY — PX: TOTAL HIP ARTHROPLASTY: SHX124

## 2018-04-17 LAB — TYPE AND SCREEN
ABO/RH(D): A POS
Antibody Screen: NEGATIVE

## 2018-04-17 SURGERY — ARTHROPLASTY, HIP, TOTAL, ANTERIOR APPROACH
Anesthesia: Spinal | Site: Hip | Laterality: Right

## 2018-04-17 MED ORDER — FENTANYL CITRATE (PF) 100 MCG/2ML IJ SOLN
INTRAMUSCULAR | Status: AC
  Filled 2018-04-17: qty 2

## 2018-04-17 MED ORDER — BISACODYL 5 MG PO TBEC: 5.0000 mg | DELAYED_RELEASE_TABLET | Freq: Every day | ORAL | Status: DC | PRN

## 2018-04-17 MED ORDER — ASPIRIN 81 MG PO CHEW
81.0000 mg | CHEWABLE_TABLET | Freq: Two times a day (BID) | ORAL | Status: DC
  Administered 2018-04-17 – 2018-04-19 (×4): 81 mg via ORAL
  Filled 2018-04-17 (×4): qty 1

## 2018-04-17 MED ORDER — PANTOPRAZOLE SODIUM 40 MG PO TBEC
80.0000 mg | DELAYED_RELEASE_TABLET | Freq: Every day | ORAL | Status: DC
  Administered 2018-04-18 – 2018-04-19 (×2): 80 mg via ORAL
  Filled 2018-04-17 (×2): qty 2

## 2018-04-17 MED ORDER — STERILE WATER FOR IRRIGATION IR SOLN
Status: DC | PRN
  Administered 2018-04-17: 2000 mL

## 2018-04-17 MED ORDER — ONDANSETRON HCL 4 MG PO TABS: 4.0000 mg | ORAL_TABLET | Freq: Four times a day (QID) | ORAL | Status: DC | PRN

## 2018-04-17 MED ORDER — ONDANSETRON HCL 4 MG/2ML IJ SOLN
INTRAMUSCULAR | Status: DC | PRN
  Administered 2018-04-17: 4 mg via INTRAVENOUS

## 2018-04-17 MED ORDER — PROPOFOL 10 MG/ML IV BOLUS
INTRAVENOUS | Status: DC | PRN
  Administered 2018-04-17: 20 mg via INTRAVENOUS
  Administered 2018-04-17: 10 mg via INTRAVENOUS

## 2018-04-17 MED ORDER — BUPIVACAINE LIPOSOME 1.3 % IJ SUSP
INTRAMUSCULAR | Status: DC | PRN
  Administered 2018-04-17: 20 mL

## 2018-04-17 MED ORDER — METHOCARBAMOL 500 MG PO TABS
500.0000 mg | ORAL_TABLET | Freq: Four times a day (QID) | ORAL | Status: DC | PRN
  Administered 2018-04-17 – 2018-04-19 (×6): 500 mg via ORAL
  Filled 2018-04-17 (×6): qty 1

## 2018-04-17 MED ORDER — DEXAMETHASONE SODIUM PHOSPHATE 10 MG/ML IJ SOLN
10.0000 mg | Freq: Once | INTRAMUSCULAR | Status: AC
  Administered 2018-04-18: 10 mg via INTRAVENOUS
  Filled 2018-04-17: qty 1

## 2018-04-17 MED ORDER — OXYCODONE HCL 5 MG PO TABS
5.0000 mg | ORAL_TABLET | ORAL | Status: DC | PRN
  Administered 2018-04-17 – 2018-04-18 (×3): 10 mg via ORAL
  Filled 2018-04-17 (×4): qty 2

## 2018-04-17 MED ORDER — PHENYLEPHRINE 40 MCG/ML (10ML) SYRINGE FOR IV PUSH (FOR BLOOD PRESSURE SUPPORT)
PREFILLED_SYRINGE | INTRAVENOUS | Status: DC | PRN
  Administered 2018-04-17 (×4): 80 ug via INTRAVENOUS

## 2018-04-17 MED ORDER — PHENOL 1.4 % MT LIQD: 1.0000 | OROMUCOSAL | Status: DC | PRN

## 2018-04-17 MED ORDER — DEXAMETHASONE SODIUM PHOSPHATE 10 MG/ML IJ SOLN
INTRAMUSCULAR | Status: AC
  Filled 2018-04-17: qty 1

## 2018-04-17 MED ORDER — BUPIVACAINE-EPINEPHRINE (PF) 0.5% -1:200000 IJ SOLN
INTRAMUSCULAR | Status: AC
  Filled 2018-04-17: qty 30

## 2018-04-17 MED ORDER — TRANEXAMIC ACID 1000 MG/10ML IV SOLN
INTRAVENOUS | Status: DC | PRN
  Administered 2018-04-17: 2000 mg via TOPICAL

## 2018-04-17 MED ORDER — KCL IN DEXTROSE-NACL 20-5-0.45 MEQ/L-%-% IV SOLN
INTRAVENOUS | Status: DC
  Administered 2018-04-17 – 2018-04-18 (×2): via INTRAVENOUS
  Filled 2018-04-17 (×3): qty 1000

## 2018-04-17 MED ORDER — HYDROMORPHONE HCL 1 MG/ML IJ SOLN
0.5000 mg | INTRAMUSCULAR | Status: DC | PRN
  Administered 2018-04-17: 0.5 mg via INTRAVENOUS
  Filled 2018-04-17: qty 0.5

## 2018-04-17 MED ORDER — PROPOFOL 10 MG/ML IV BOLUS
INTRAVENOUS | Status: AC
  Filled 2018-04-17: qty 20

## 2018-04-17 MED ORDER — ASPIRIN EC 81 MG PO TBEC: 81.0000 mg | DELAYED_RELEASE_TABLET | Freq: Two times a day (BID) | ORAL | 0 refills | Status: AC

## 2018-04-17 MED ORDER — TIZANIDINE HCL 2 MG PO TABS: 2.0000 mg | ORAL_TABLET | Freq: Four times a day (QID) | ORAL | 0 refills | Status: DC | PRN

## 2018-04-17 MED ORDER — METOCLOPRAMIDE HCL 5 MG PO TABS: 5.0000 mg | ORAL_TABLET | Freq: Three times a day (TID) | ORAL | Status: DC | PRN

## 2018-04-17 MED ORDER — METOCLOPRAMIDE HCL 5 MG/ML IJ SOLN: 5.0000 mg | Freq: Three times a day (TID) | INTRAMUSCULAR | Status: DC | PRN

## 2018-04-17 MED ORDER — PROPOFOL 10 MG/ML IV BOLUS
INTRAVENOUS | Status: AC
  Filled 2018-04-17: qty 60

## 2018-04-17 MED ORDER — GABAPENTIN 300 MG PO CAPS
300.0000 mg | ORAL_CAPSULE | Freq: Three times a day (TID) | ORAL | Status: DC
  Administered 2018-04-17 – 2018-04-19 (×6): 300 mg via ORAL
  Filled 2018-04-17 (×6): qty 1

## 2018-04-17 MED ORDER — DIPHENHYDRAMINE HCL 12.5 MG/5ML PO ELIX: 12.5000 mg | ORAL_SOLUTION | ORAL | Status: DC | PRN

## 2018-04-17 MED ORDER — SODIUM CHLORIDE 0.9 % IJ SOLN
INTRAMUSCULAR | Status: AC
  Filled 2018-04-17: qty 50

## 2018-04-17 MED ORDER — ACETAMINOPHEN 500 MG PO TABS
1000.0000 mg | ORAL_TABLET | Freq: Four times a day (QID) | ORAL | Status: AC
  Administered 2018-04-17 – 2018-04-18 (×4): 1000 mg via ORAL
  Filled 2018-04-17 (×4): qty 2

## 2018-04-17 MED ORDER — CHLORHEXIDINE GLUCONATE 4 % EX LIQD: 60.0000 mL | Freq: Once | CUTANEOUS | Status: DC

## 2018-04-17 MED ORDER — LIDOCAINE 2% (20 MG/ML) 5 ML SYRINGE
INTRAMUSCULAR | Status: AC
  Filled 2018-04-17: qty 5

## 2018-04-17 MED ORDER — ONDANSETRON HCL 4 MG/2ML IJ SOLN: 4.0000 mg | Freq: Four times a day (QID) | INTRAMUSCULAR | Status: DC | PRN

## 2018-04-17 MED ORDER — ONDANSETRON HCL 4 MG/2ML IJ SOLN: 4.0000 mg | Freq: Once | INTRAMUSCULAR | Status: DC | PRN

## 2018-04-17 MED ORDER — MENTHOL 3 MG MT LOZG: 1.0000 | LOZENGE | OROMUCOSAL | Status: DC | PRN

## 2018-04-17 MED ORDER — FAMOTIDINE 20 MG PO TABS
20.0000 mg | ORAL_TABLET | Freq: Every day | ORAL | Status: DC
  Administered 2018-04-17 – 2018-04-18 (×2): 20 mg via ORAL
  Filled 2018-04-17 (×2): qty 1

## 2018-04-17 MED ORDER — ALUM & MAG HYDROXIDE-SIMETH 200-200-20 MG/5ML PO SUSP: 30.0000 mL | ORAL | Status: DC | PRN

## 2018-04-17 MED ORDER — LOSARTAN POTASSIUM 50 MG PO TABS
50.0000 mg | ORAL_TABLET | Freq: Every day | ORAL | Status: DC
  Administered 2018-04-18 – 2018-04-19 (×2): 50 mg via ORAL
  Filled 2018-04-17 (×2): qty 1

## 2018-04-17 MED ORDER — DEXAMETHASONE SODIUM PHOSPHATE 10 MG/ML IJ SOLN
INTRAMUSCULAR | Status: DC | PRN
  Administered 2018-04-17: 10 mg via INTRAVENOUS

## 2018-04-17 MED ORDER — DOCUSATE SODIUM 100 MG PO CAPS
100.0000 mg | ORAL_CAPSULE | Freq: Two times a day (BID) | ORAL | Status: DC
  Administered 2018-04-17 – 2018-04-19 (×4): 100 mg via ORAL
  Filled 2018-04-17 (×4): qty 1

## 2018-04-17 MED ORDER — LORATADINE 10 MG PO TABS
10.0000 mg | ORAL_TABLET | Freq: Every day | ORAL | Status: DC
  Administered 2018-04-18 – 2018-04-19 (×2): 10 mg via ORAL
  Filled 2018-04-17 (×2): qty 1

## 2018-04-17 MED ORDER — POLYETHYLENE GLYCOL 3350 17 G PO PACK: 17.0000 g | PACK | Freq: Every day | ORAL | Status: DC | PRN

## 2018-04-17 MED ORDER — FLEET ENEMA 7-19 GM/118ML RE ENEM: 1.0000 | ENEMA | Freq: Once | RECTAL | Status: DC | PRN

## 2018-04-17 MED ORDER — BUPIVACAINE IN DEXTROSE 0.75-8.25 % IT SOLN
INTRATHECAL | Status: DC | PRN
  Administered 2018-04-17: 2 mL via INTRATHECAL

## 2018-04-17 MED ORDER — LACTATED RINGERS IV SOLN
INTRAVENOUS | Status: DC
  Administered 2018-04-17 (×2): via INTRAVENOUS

## 2018-04-17 MED ORDER — LIDOCAINE 2% (20 MG/ML) 5 ML SYRINGE
INTRAMUSCULAR | Status: DC | PRN
  Administered 2018-04-17: 20 mg via INTRAVENOUS

## 2018-04-17 MED ORDER — CEFAZOLIN SODIUM-DEXTROSE 2-4 GM/100ML-% IV SOLN
2.0000 g | INTRAVENOUS | Status: AC
  Administered 2018-04-17: 2 g via INTRAVENOUS
  Filled 2018-04-17: qty 100

## 2018-04-17 MED ORDER — TRANEXAMIC ACID 1000 MG/10ML IV SOLN
1000.0000 mg | Freq: Once | INTRAVENOUS | Status: AC
  Administered 2018-04-17: 1000 mg via INTRAVENOUS
  Filled 2018-04-17: qty 1100

## 2018-04-17 MED ORDER — PHENYLEPHRINE 40 MCG/ML (10ML) SYRINGE FOR IV PUSH (FOR BLOOD PRESSURE SUPPORT)
PREFILLED_SYRINGE | INTRAVENOUS | Status: AC
  Filled 2018-04-17: qty 10

## 2018-04-17 MED ORDER — PROPOFOL 500 MG/50ML IV EMUL
INTRAVENOUS | Status: DC | PRN
  Administered 2018-04-17: 75 ug/kg/min via INTRAVENOUS

## 2018-04-17 MED ORDER — OXYCODONE-ACETAMINOPHEN 5-325 MG PO TABS: 1.0000 | ORAL_TABLET | ORAL | 0 refills | Status: AC | PRN

## 2018-04-17 MED ORDER — METHOCARBAMOL 1000 MG/10ML IJ SOLN
500.0000 mg | Freq: Four times a day (QID) | INTRAVENOUS | Status: DC | PRN
  Administered 2018-04-17: 500 mg via INTRAVENOUS
  Filled 2018-04-17: qty 550

## 2018-04-17 MED ORDER — ACETAMINOPHEN 325 MG PO TABS
325.0000 mg | ORAL_TABLET | Freq: Four times a day (QID) | ORAL | Status: DC | PRN
  Administered 2018-04-18 – 2018-04-19 (×3): 650 mg via ORAL
  Filled 2018-04-17 (×3): qty 2

## 2018-04-17 MED ORDER — ONDANSETRON HCL 4 MG/2ML IJ SOLN
INTRAMUSCULAR | Status: AC
  Filled 2018-04-17: qty 2

## 2018-04-17 MED ORDER — 0.9 % SODIUM CHLORIDE (POUR BTL) OPTIME
TOPICAL | Status: DC | PRN
  Administered 2018-04-17: 1000 mL

## 2018-04-17 MED ORDER — FENTANYL CITRATE (PF) 100 MCG/2ML IJ SOLN
INTRAMUSCULAR | Status: DC | PRN
  Administered 2018-04-17 (×2): 50 ug via INTRAVENOUS

## 2018-04-17 MED ORDER — FENTANYL CITRATE (PF) 100 MCG/2ML IJ SOLN: 25.0000 ug | INTRAMUSCULAR | Status: DC | PRN

## 2018-04-17 MED ORDER — AMLODIPINE BESYLATE 10 MG PO TABS
10.0000 mg | ORAL_TABLET | Freq: Every morning | ORAL | Status: DC
  Administered 2018-04-18 – 2018-04-19 (×2): 10 mg via ORAL
  Filled 2018-04-17 (×2): qty 1

## 2018-04-17 MED ORDER — BUPIVACAINE-EPINEPHRINE (PF) 0.5% -1:200000 IJ SOLN
INTRAMUSCULAR | Status: DC | PRN
  Administered 2018-04-17: 20 mL

## 2018-04-17 MED ORDER — SODIUM CHLORIDE 0.9 % IJ SOLN
INTRAMUSCULAR | Status: DC | PRN
  Administered 2018-04-17: 50 mL

## 2018-04-17 SURGICAL SUPPLY — 34 items
BAG DECANTER FOR FLEXI CONT (MISCELLANEOUS) ×3 IMPLANT
BLADE SAW SGTL 18X1.27X75 (BLADE) ×2 IMPLANT
BLADE SAW SGTL 18X1.27X75MM (BLADE) ×1
CAPT HIP TOTAL 2 ×3 IMPLANT
COVER PERINEAL POST (MISCELLANEOUS) ×3 IMPLANT
COVER SURGICAL LIGHT HANDLE (MISCELLANEOUS) ×3 IMPLANT
DRSG AQUACEL AG ADV 3.5X10 (GAUZE/BANDAGES/DRESSINGS) ×3 IMPLANT
DURAPREP 26ML APPLICATOR (WOUND CARE) ×3 IMPLANT
ELECT REM PT RETURN 9FT ADLT (ELECTROSURGICAL) ×3
ELECTRODE REM PT RTRN 9FT ADLT (ELECTROSURGICAL) ×1 IMPLANT
GLOVE BIO SURGEON STRL SZ8.5 (GLOVE) IMPLANT
GLOVE BIOGEL PI IND STRL 8 (GLOVE) ×1 IMPLANT
GLOVE BIOGEL PI IND STRL 9 (GLOVE) ×1 IMPLANT
GLOVE BIOGEL PI INDICATOR 8 (GLOVE) ×2
GLOVE BIOGEL PI INDICATOR 9 (GLOVE) ×2
GOWN STRL REUS W/ TWL LRG LVL3 (GOWN DISPOSABLE) ×1 IMPLANT
GOWN STRL REUS W/ TWL XL LVL3 (GOWN DISPOSABLE) ×4 IMPLANT
GOWN STRL REUS W/TWL LRG LVL3 (GOWN DISPOSABLE) ×2
GOWN STRL REUS W/TWL XL LVL3 (GOWN DISPOSABLE) ×8
MANIFOLD NEPTUNE II (INSTRUMENTS) ×3 IMPLANT
NEEDLE HYPO 22GX1.5 SAFETY (NEEDLE) ×6 IMPLANT
NS IRRIG 1000ML POUR BTL (IV SOLUTION) ×3 IMPLANT
PACK ANTERIOR HIP CUSTOM (KITS) ×3 IMPLANT
SUT ETHIBOND NAB CT1 #1 30IN (SUTURE) ×3 IMPLANT
SUT VIC AB 0 CT1 27 (SUTURE)
SUT VIC AB 0 CT1 27XBRD ANBCTR (SUTURE) IMPLANT
SUT VIC AB 1 CTX 36 (SUTURE) ×2
SUT VIC AB 1 CTX36XBRD ANBCTR (SUTURE) ×1 IMPLANT
SUT VIC AB 2-0 CT1 27 (SUTURE) ×2
SUT VIC AB 2-0 CT1 TAPERPNT 27 (SUTURE) ×1 IMPLANT
SUT VIC AB 3-0 CT1 27 (SUTURE) ×2
SUT VIC AB 3-0 CT1 TAPERPNT 27 (SUTURE) ×1 IMPLANT
SYR CONTROL 10ML LL (SYRINGE) ×6 IMPLANT
TRAY FOLEY BAG SILVER LF 16FR (CATHETERS) ×3 IMPLANT

## 2018-04-17 NOTE — Op Note (Signed)
OPERATIVE REPORT    DATE OF PROCEDURE:  04/17/2018       PREOPERATIVE DIAGNOSIS:  RIGHT HIP OSTEOARTHRITIS                                                          POSTOPERATIVE DIAGNOSIS:  RIGHT HIP OSTEOARTHRITIS                                                           PROCEDURE: Anterior R total hip arthroplasty using a 50 mm DePuy Pinnacle  Cup, [employer], 0-degree polyethylene liner, a +1 32 mm metal head, a 5 stdmm Depuy Triloc stem   SURGEON: Kerin Salen    ASSISTANT:   Kerry Hough. [employer]  (present throughout entire procedure and necessary for timely completion of the procedure)   ANESTHESIA: Spinal BLOOD LOSS: 300 FLUID REPLACEMENT: 1600 crystalloid Antibiotic: 2gm ancef Tranexamic Acid: 1gm IV, 2gm Topical COMPLICATIONS: none    INDICATIONS FOR PROCEDURE: A 77 y.o. year-old With  RIGHT HIP OSTEOARTHRITIS   for 3 years, x-rays show bone-on-bone arthritic changes, and osteophytes. Despite conservative measures with observation, anti-inflammatory medicine, narcotics, use of a cane, has severe unremitting pain and can ambulate only a few blocks before resting. Patient desires elective R total hip arthroplasty to decrease pain and increase function. The risks, benefits, and alternatives were discussed at length including but not limited to the risks of infection, bleeding, nerve injury, stiffness, blood clots, the need for revision surgery, cardiopulmonary complications, among others, and they were willing to proceed. Questions answered     PROCEDURE IN DETAIL: The patient was identified by armband,  received preoperative IV antibiotics in the holding area at [facility], taken to the operating room , appropriate anesthetic monitors  were attached and  anesthesia was induced with the patienton the gurney. The HANA boots were applied to the feet and he was then transferred to the HANA table with a peroneal post and support underneath the non-operative le,  which was locked in 5 lb traction. Theoperative lower extremity was then prepped and draped in the usual sterile fashion from just above the iliac crest to the knee. And a timeout procedure was performed. We then made a 12 cm incision along the interval at the leading edge of the tensor fascia lata of starting at 2 cm lateral to and 2 cm distal to the ASIS. Small bleeders in the skin and subcutaneous tissue identified and cauterized we dissected down to the fascia and made an incision in the fascia allowing [place] to elevate the fascia of the tensor muscle and exploited the interval between the rectus and the tensor fascia lata. A Hohmann retractor was then placed along the superior neck of the femur and a Cobra retractor along the inferior neck of the femur we teed the capsule starting out at the superior anterior aspect of the acetabulum going distally and made the T along the neck both leaflets of the T were tagged with #2 Ethibond suture. Cobra retractors were then placed along the inferior and superior neck allowing [place] to perform a standard neck cut  and removed the femoral head with a power corkscrew. We then placed a right angle Hohmann retractor along the anterior aspect of the acetabulum a spiked Cobra in the cotyloid notch and posteriorly a Muelller retractor. We then sequentially reamed up to a 50 mm basket reamer obtaining good coverage in all quadrants, verified by C-arm imaging. Under C-arm control with and hammered into place a 50 mm Pinnacle cup in 45 of abduction and 15 of anteversion. The cup seated nicely and required no supplemental screws. We then placed a central hole Eliminator and a 0 polyethylene liner. The foot was then externally rotated to 110, the HANA elevator was placed around the flare of the greater trochanter and the limb was extended and abducted delivering the proximal femur up into the wound. A medium Hohmann retractor was placed over the greater trochanter and a Mueller retractor  along the posterior femoral neck completing the exposure. We then performed releases superiorly and and inferiorly of the capsule going back to the pirformis fossa superiorly and to the lesser trochanter inferiorly. We then entered the proximal femur with the box cutting offset chisel followed by, a canal sounder, the chili pepper and broaching up to a 5 broach. This seated nicely and we reamed the calcar. A trial reduction was performed with a 1 mm 32 mm head.The limb lengths were excellent the hip was stable in 90 of external rotation. At this point the trial components removed and we hammered into place a # 5 std  Offset Tri-Lock stem with Gryption coating. A + 1 x 32 mm metal ball was then hammered into place the hip was reduced and final C-arm images obtained. The wound was thoroughly irrigated with normal saline solution. We repaired the ant capsule and the tensor fascia lot a with running 0 vicryl suture. the subcutaneous tissue was closed with 2-0 and 3-0 Vicryl suture followed by an Aquacil dressing. At this point the patient was awaken and transferred to hospital gurney without difficulty. The subcutaneous tissue with 0 and 2-0 undyed Vicryl suture and the skin with running  3-0 vicryl subcuticular suture. Aquacil dressing was applied. The patient was then unclamped, rolled supine, awaken extubated and taken to recovery room without difficulty in stable condition.   Kerin Salen 04/17/2018, 11:06 AM

## 2018-04-17 NOTE — Transfer of Care (Signed)
Immediate Anesthesia Transfer of Care Note  Patient: [name]  Procedure(s) Performed: RIGHT TOTAL HIP ARTHROPLASTY ANTERIOR APPROACH (Right Hip)  Patient Location: PACU  Anesthesia Type:Spinal  Level of Consciousness: sedated  Airway & Oxygen Therapy: Patient Spontanous Breathing and Patient connected to face mask oxygen  Post-op Assessment: Report given to RN and Post -op Vital signs reviewed and stable  Post vital signs: Reviewed and stable  Last Vitals:  Vitals Value Taken Time  BP    Temp    Pulse 73 04/17/2018 11:41 AM  Resp 13 04/17/2018 11:41 AM  SpO2 100 % 04/17/2018 11:41 AM  Vitals shown include unvalidated device data.  Last Pain:  Vitals:   04/17/18 0712  TempSrc: Oral      Patients Stated Pain Goal: 4 (20/10/07 1219)  Complications: No apparent anesthesia complications

## 2018-04-17 NOTE — Anesthesia Postprocedure Evaluation (Signed)
Anesthesia Post Note  Patient: Hannah Chen  Procedure(s) Performed: RIGHT TOTAL HIP ARTHROPLASTY ANTERIOR APPROACH (Right Hip)     Patient location during evaluation: PACU Anesthesia Type: Spinal Level of consciousness: oriented and awake and alert Pain management: pain level controlled Vital Signs Assessment: post-procedure vital signs reviewed and stable Respiratory status: spontaneous breathing, respiratory function stable and nonlabored ventilation Cardiovascular status: blood pressure returned to baseline and stable Postop Assessment: no headache, no backache, no apparent nausea or vomiting, patient able to bend at knees and spinal receding Anesthetic complications: no    Last Vitals:  Vitals:   04/17/18 1416 04/17/18 1527  BP: 139/63 132/65  Pulse: 76 85  Resp: 18 17  Temp: 37.1 C 36.7 C  SpO2: 94% 96%    Last Pain:  Vitals:   04/17/18 1527  TempSrc: Oral  PainSc:                  Catalina Gravel

## 2018-04-17 NOTE — Anesthesia Procedure Notes (Signed)
Date/Time: 04/17/2018 10:01 AM Performed by: Talbot Grumbling, CRNA Oxygen Delivery Method: Simple face mask

## 2018-04-17 NOTE — Anesthesia Procedure Notes (Signed)
Spinal  Patient location during procedure: OR Start time: 04/17/2018 9:48 AM End time: 04/17/2018 9:51 AM Staffing Anesthesiologist: Catalina Gravel, MD Performed: anesthesiologist  Preanesthetic Checklist Completed: patient identified, surgical consent, pre-op evaluation, timeout performed, IV checked, risks and benefits discussed and monitors and equipment checked Spinal Block Patient position: sitting Prep: site prepped and draped and DuraPrep Patient monitoring: continuous pulse ox and blood pressure Approach: midline Location: L2-3 Injection technique: single-shot Needle Needle type: Pencan  Needle gauge: 24 G Additional Notes Functioning IV was confirmed and monitors were applied. Sterile prep and drape, including hand hygiene, mask and sterile gloves were used. The patient was positioned and the spine was prepped. The skin was anesthetized with lidocaine.  Free flow of clear CSF was obtained prior to injecting local anesthetic into the CSF.  The spinal needle aspirated freely following injection.  The needle was carefully withdrawn.  The patient tolerated the procedure well. Consent was obtained prior to procedure with all questions answered and concerns addressed. Risks including but not limited to bleeding, infection, nerve damage, paralysis, failed block, inadequate analgesia, allergic reaction, high spinal, itching and headache were discussed and the patient wished to proceed.   Hoy Morn, MD

## 2018-04-17 NOTE — Anesthesia Preprocedure Evaluation (Addendum)
Anesthesia Evaluation  Patient identified by MRN, date of birth, ID band Patient awake    Reviewed: Allergy & Precautions, NPO status , Patient's Chart, lab work & pertinent test results  Airway Mallampati: II  TM Distance: >3 FB Neck ROM: Full    Dental  (+) Dental Advisory Given, Caps   Pulmonary neg pulmonary ROS,    Pulmonary exam normal breath sounds clear to auscultation       Cardiovascular hypertension, Pt. on medications Normal cardiovascular exam Rhythm:Regular Rate:Normal     Neuro/Psych L3-4 & L5-S1 laminectomies negative psych ROS   GI/Hepatic Neg liver ROS, hiatal hernia, GERD  Medicated,  Endo/Other    Renal/GU negative Renal ROS     Musculoskeletal  (+) Arthritis , Osteoarthritis,    Abdominal   Peds  Hematology negative hematology ROS (+) Plt 295k   Anesthesia Other Findings Day of surgery medications reviewed with the patient.  Reproductive/Obstetrics                            Anesthesia Physical Anesthesia Plan  ASA: III  Anesthesia Plan: Spinal   Post-op Pain Management:    Induction:   PONV Risk Score and Plan: 2 and Ondansetron and Propofol infusion  Airway Management Planned: Simple Face Mask  Additional Equipment:   Intra-op Plan:   Post-operative Plan:   Informed Consent: I have reviewed the patients History and Physical, chart, labs and discussed the procedure including the risks, benefits and alternatives for the proposed anesthesia with the patient or authorized representative who has indicated his/her understanding and acceptance.   Dental advisory given  Plan Discussed with: CRNA, Anesthesiologist and Surgeon  Anesthesia Plan Comments:         Anesthesia Quick Evaluation

## 2018-04-17 NOTE — Interval H&P Note (Signed)
History and Physical Interval Note:  04/17/2018 9:40 AM  [name]  has presented today for surgery, with the diagnosis of RIGHT HIP OSTEOARTHRITIS  The various methods of treatment have been discussed with the patient and family. After consideration of risks, benefits and other options for treatment, the patient has consented to  Procedure(s): RIGHT TOTAL HIP ARTHROPLASTY ANTERIOR APPROACH (Right) as a surgical intervention .  The patient's history has been reviewed, patient examined, no change in status, stable for surgery.  I have reviewed the patient's chart and labs.  Questions were answered to the patient's satisfaction.     Kerin Salen

## 2018-04-17 NOTE — Evaluation (Signed)
Physical Therapy Evaluation Patient Details Name: Hannah Chen MRN: [id number] DOB: 01/18/1941 Today's Date: 04/17/2018   History of Present Illness  pt s/p DA THA on 04/17/2018, with h/o LTKA 2016, and back laminectomy 2001. PMH: GERD, HTN and arthritis.   Clinical Impression  Pt is s/p THA resulting in the deficits listed below (see PT Problem List). t will benefit from acute PT to increase their independence and safety with mobility to allow discharge home with husband assisting.       Follow Up Recommendations Follow surgeon's recommendation for DC plan and follow-up therapies(pt states MD has set up HHPT already)    Equipment Recommendations  None recommended by PT    Recommendations for Other Services       Precautions / Restrictions Precautions Precautions: None Restrictions Weight Bearing Restrictions: No      Mobility  Bed Mobility Overal bed mobility: Needs Assistance Bed Mobility: Supine to Sit;Sit to Supine     Supine to sit: Supervision     General bed mobility comments: pt was sitting EOB with help from her husband when I arrived. Pt was sittign near edge with hips in abduction and flexed at [employer], stating she just stood at EOB with her husband to use the female urinal. There was a bit to clean up and explained to please call for help so staff can assist . I got everything cleaned up and got patient into a better position as well. Educated with safety .   Transfers Overall transfer level: Needs assistance Equipment used: Rolling walker (2 wheeled) Transfers: Sit to/from Stand Sit to Stand: Min assist         General transfer comment: cues for RW safety and slight assit to elevate from surface due to a little pain in anterior R grion area.   Ambulation/Gait Ambulation/Gait assistance: Min guard Ambulation Distance (Feet): 50 Feet Assistive device: Rolling walker (2 wheeled) Gait Pattern/deviations: Step-to pattern     General Gait Details:  educated with step pattern during first session to assist R LE and minimize pain as much as possible, and increase safety due to patient still feeling slight numbness in lateral thigh area.   Stairs            Wheelchair Mobility    Modified Rankin (Stroke Patients Only)       Balance                                             Pertinent Vitals/Pain Pain Assessment: 0-10 Pain Score: 3  Pain Location: in R grion area and in right lateral thigh with weight bearing with walking with RW  Pain Descriptors / Indicators: Aching;Dull Pain Intervention(s): Monitored during session;Ice applied    Home Living Family/patient expects to be discharged to:: Private residence(renting home here for recovery. Lives in [place] ) Living Arrangements: Spouse/significant other Available Help at Discharge: Family Type of Home: House Home Access: Stairs to enter Entrance Stairs-Rails: Right Entrance Stairs-Number of Steps: 2 Home Layout: One level Home Equipment: Environmental consultant - 2 wheels;Cane - single point      Prior Function Level of Independence: Independent         Comments: pt likes to be active, hike, walk and go tot he [employer].      Hand Dominance   Dominant Hand: Right    Extremity/Trunk Assessment  Lower Extremity Assessment Lower Extremity Assessment: Overall WFL for tasks assessed(limited ROM ,not tested, per pt report from prior to surgery. PT had tight hip flexors therefore that could be why she is experiencing hip flexor pain )       Communication   Communication: No difficulties  Cognition Arousal/Alertness: Awake/alert Behavior During Therapy: WFL for tasks assessed/performed Overall Cognitive Status: Within Functional Limits for tasks assessed                                        General Comments      Exercises     Assessment/Plan    PT Assessment Patient needs continued PT services  PT Problem List Decreased  strength;Decreased activity tolerance;Decreased mobility       PT Treatment Interventions Gait training;DME instruction;Therapeutic activities;Therapeutic exercise;Stair training;Functional mobility training;Patient/family education    PT Goals (Current goals can be found in the Care Plan section)  Acute Rehab PT Goals Patient Stated Goal: I want to be able to walk and hike again with less pain  PT Goal Formulation: With patient/family Time For Goal Achievement: 05/01/18 Potential to Achieve Goals: Good    Frequency 7X/week   Barriers to discharge        Co-evaluation               AM-PAC PT "6 Clicks" Daily Activity  Outcome Measure Difficulty turning over in bed (including adjusting bedclothes, sheets and blankets)?: Unable Difficulty moving from lying on back to sitting on the side of the bed? : Unable Difficulty sitting down on and standing up from a chair with arms (e.g., wheelchair, bedside commode, etc,.)?: Unable Help needed moving to and from a bed to chair (including a wheelchair)?: A Little Help needed walking in hospital room?: A Little Help needed climbing 3-5 steps with a railing? : A Lot 6 Click Score: 11    End of Session Equipment Utilized During Treatment: Gait belt Activity Tolerance: Patient tolerated treatment well Patient left: in chair;with call bell/phone within reach;with family/visitor present;Other (comment)(educated pt about chair alarm, pt stated she did not need it. She promised to call for help and assistance to get up. ) Nurse Communication: Mobility status(educated CNA and nurse on pt an dhusband getting up by themselves . ) PT Visit Diagnosis: Other abnormalities of gait and mobility (R26.89)    Time: 1815-1900 PT Time Calculation (min) (ACUTE ONLY): 45 min   Charges:   PT Evaluation $PT Eval Low Complexity: 1 Low PT Treatments $Gait Training: 8-22 mins $Therapeutic Activity: 8-22 mins   PT G Codes:        Clide Dales,  PT Pager: [phone] 04/17/2018   Prisila Dlouhy, Gatha Mayer 04/17/2018, 7:26 PM

## 2018-04-18 LAB — BASIC METABOLIC PANEL
Anion gap: 8 (ref 5–15)
BUN: 13 mg/dL (ref 6–20)
CHLORIDE: 106 mmol/L (ref 101–111)
CO2: 27 mmol/L (ref 22–32)
Calcium: 9.9 mg/dL (ref 8.9–10.3)
Creatinine, Ser: 0.72 mg/dL (ref 0.44–1.00)
GFR calc non Af Amer: >60 mL/min (ref >60)
Glucose, Bld: 142 mg/dL — ABNORMAL HIGH (ref 65–99)
POTASSIUM: 4.7 mmol/L (ref 3.5–5.1)
Sodium: 141 mmol/L (ref 135–145)

## 2018-04-18 LAB — CBC
HEMATOCRIT: 37.5 % (ref 36.0–46.0)
HEMOGLOBIN: 12.6 g/dL (ref 12.0–15.0)
MCH: 31 pg (ref 26.0–34.0)
MCHC: 33.6 g/dL (ref 30.0–36.0)
MCV: 92.4 fL (ref 78.0–100.0)
Platelets: 295 10*3/uL (ref 150–400)
RBC: 4.06 MIL/uL (ref 3.87–5.11)
RDW: 13.2 % (ref 11.5–15.5)
WBC: 20.6 10*3/uL — ABNORMAL HIGH (ref 4.0–10.5)

## 2018-04-18 NOTE — Progress Notes (Signed)
Physical Therapy Treatment Patient Details Name: Hannah Chen MRN: [id number] DOB: [birth year] Today's Date: 04/18/2018    History of Present Illness pt s/p DA THA on 04/17/2018, with h/o LTKA 2016, and back laminectomy 2001. PMH: GERD, HTN and arthritis.     PT Comments    POD # 1 pm session Assisted with amb an increased distance however c/o slight nausea/fatigue and not able to attempt stairs.  Assisted back to bed and performed some THR TE's followed by ICE.  Pt did not perform as well as expected and would prefer to D/C to home tomorrow.   Follow Up Recommendations  Follow surgeon's recommendation for DC plan and follow-up therapies;Home health PT     Equipment Recommendations  None recommended by PT    Recommendations for Other Services       Precautions / Restrictions Precautions Precautions: None Restrictions Weight Bearing Restrictions: No    Mobility  Bed Mobility Overal bed mobility: Needs Assistance Bed Mobility: Sit to Supine     Supine to sit: Supervision;Min guard Sit to supine: Min assist   General bed mobility comments: assisted back to bed   Transfers Overall transfer level: Needs assistance Equipment used: Rolling walker (2 wheeled) Transfers: Sit to/from [employer] Sit to Stand: Min guard;Supervision Stand pivot transfers: Supervision;Min guard       General transfer comment: cues for RW safety and slight assit to elevate   Ambulation/Gait Ambulation/Gait assistance: Min guard Ambulation Distance (Feet): 63 Feet Assistive device: Rolling walker (2 wheeled) Gait Pattern/deviations: Step-to pattern;Step-through pattern Gait velocity: decreased   General Gait Details: VC's safety with turns   Stairs Stairs: (unable to attempt due to c/o mild nausea with fatigue)           Wheelchair Mobility    Modified Rankin (Stroke Patients Only)       Balance                                             Cognition Arousal/Alertness: Awake/alert Behavior During Therapy: WFL for tasks assessed/performed Overall Cognitive Status: Within Functional Limits for tasks assessed                                        Exercises      General Comments        Pertinent Vitals/Pain Pain Assessment: 0-10 Pain Score: 6  Pain Location: R upeer thigh and groin "burning" Pain Descriptors / Indicators: Aching;Burning Pain Intervention(s): Monitored during session;Repositioned;Ice applied    Home Living                      Prior Function            PT Goals (current goals can now be found in the care plan section) Progress towards PT goals: Progressing toward goals    Frequency    7X/week      PT Plan Current plan remains appropriate    Co-evaluation              AM-PAC PT "6 Clicks" Daily Activity  Outcome Measure  Difficulty turning over in bed (including adjusting bedclothes, sheets and blankets)?: A Lot Difficulty moving from lying on back to sitting on the side of the bed? : A Lot Difficulty  sitting down on and standing up from a chair with arms (e.g., wheelchair, bedside commode, etc,.)?: A Lot Help needed moving to and from a bed to chair (including a wheelchair)?: A Little Help needed walking in hospital room?: A Little Help needed climbing 3-5 steps with a railing? : A Lot 6 Click Score: 14    End of Session Equipment Utilized During Treatment: Gait belt Activity Tolerance: Patient tolerated treatment well Patient left: in chair;with call bell/phone within reach;with family/visitor present;Other (comment) Nurse Communication: Mobility status PT Visit Diagnosis: Other abnormalities of gait and mobility (R26.89)     Time: 3437-3578 PT Time Calculation (min) (ACUTE ONLY): 24 min  Charges:  $Gait Training: 8-22 mins $Therapeutic Exercise: 8-22 mins                    G Codes:      Rica Koyanagi  PTA [facility]  Acute  Rehab Pager       [phone]

## 2018-04-18 NOTE — Progress Notes (Signed)
Per office CM: Patient resides in [place] but will discharge to local address with Husband and HHPT from [facility] at home. SHe has all needed equipment at home. She is set to see Dr. Mayer Camel in the office on 04/28/18 @ 1000. Her OPPT need will be determined at that time.   Please contact Ladell Heads, [place] with any questions or if this plan needs to change.

## 2018-04-18 NOTE — Progress Notes (Signed)
PATIENT ID: Hannah Chen  MRN: [id number]  DOB/AGE:  1941/10/07 / 77 y.o.  1 Day Post-Op Procedure(s) (LRB): RIGHT TOTAL HIP ARTHROPLASTY ANTERIOR APPROACH (Right)    PROGRESS NOTE Subjective: Patient is alert, oriented, no Nausea, no Vomiting, yes passing gas, . Taking PO well. Denies SOB, Chest or Calf Pain. Using Incentive Spirometer, PAS in place. Ambulate 23' Patient reports pain as  3/10  .    Objective: Vital signs in last 24 hours: Vitals:   04/17/18 1914 04/17/18 2108 04/18/18 0214 04/18/18 0602  BP:  140/72 120/61 122/61  Pulse:  78 80 76  Resp:  16 17 17   Temp:  97.6 F (36.4 C) 98.1 F (36.7 C) 98.2 F (36.8 C)  TempSrc:  Oral Oral Oral  SpO2: 98% 96% 94% 96%  Weight:      Height:          Intake/Output from previous day: I/O last 3 completed shifts: In: 3916.7 [P.O.:520; I.V.:3031.7; IV Piggyback:365] Out: 3150 [Urine:2850; Blood:300]   Intake/Output this shift: No intake/output data recorded.   LABORATORY DATA: Recent Labs    04/18/18 0546  WBC 20.6*  HGB 12.6  HCT 37.5  PLT 295  NA 141  K 4.7  CL 106  CO2 27  BUN 13  CREATININE 0.72  GLUCOSE 142*  CALCIUM 9.9    Examination: Neurologically intact ABD soft Neurovascular intact Sensation intact distally Intact pulses distally Dorsiflexion/Plantar flexion intact Incision: dressing C/D/I No cellulitis present Compartment soft} XR AP&Lat of hip shows well placed\fixed THA  Assessment:   1 Day Post-Op Procedure(s) (LRB): RIGHT TOTAL HIP ARTHROPLASTY ANTERIOR APPROACH (Right) ADDITIONAL DIAGNOSIS:  Expected Acute Blood Loss Anemia, Hypertension  Plan: PT/OT WBAT, THA  DVT Prophylaxis: SCDx72 hrs, ASA 325 mg BID x 2 weeks  DISCHARGE PLAN: Home, today or tomorrow based on PT goals, will be at airbnb for 2 weeks, local.  DISCHARGE NEEDS: HHPT, Walker and 3-in-1 comode seat

## 2018-04-18 NOTE — Progress Notes (Signed)
Physical Therapy Treatment Patient Details Name: Hannah Chen MRN: [id number] DOB: 11-19-1941 Today's Date: 04/18/2018    History of Present Illness pt s/p DA THA on 04/17/2018, with h/o LTKA 2016, and back laminectomy 2001. PMH: GERD, HTN and arthritis.     PT Comments    POD # 1 am session Assisted OOB to amb an increased distance.  Mild c/o nausea.  Positioned in recliner and performed 3 THR TE's followed by ICE.   Follow Up Recommendations  Follow surgeon's recommendation for DC plan and follow-up therapies;Home health PT     Equipment Recommendations  None recommended by PT    Recommendations for Other Services       Precautions / Restrictions Precautions Precautions: None Restrictions Weight Bearing Restrictions: No    Mobility  Bed Mobility Overal bed mobility: Needs Assistance Bed Mobility: Supine to Sit     Supine to sit: Supervision;Min guard     General bed mobility comments: increased time and assist due to increased pain today vs yesterday  Transfers Overall transfer level: Needs assistance Equipment used: Rolling walker (2 wheeled) Transfers: Sit to/from [employer] Transfers Sit to Stand: Min guard;Supervision Stand pivot transfers: Supervision;Min guard       General transfer comment: cues for RW safety and slight assit to elevate   Ambulation/Gait Ambulation/Gait assistance: Min guard Ambulation Distance (Feet): 55 Feet Assistive device: Rolling walker (2 wheeled) Gait Pattern/deviations: Step-to pattern;Step-through pattern Gait velocity: decreased   General Gait Details: VC's safety with turns   Chief Strategy Officer    Modified Rankin (Stroke Patients Only)       Balance                                            Cognition Arousal/Alertness: Awake/alert Behavior During Therapy: WFL for tasks assessed/performed Overall Cognitive Status: Within Functional Limits for tasks  assessed                                        Exercises   AP Knee presses Glut squeezes   General Comments        Pertinent Vitals/Pain Pain Assessment: 0-10 Pain Score: 6  Pain Location: R upeer thigh and groin "burning" Pain Descriptors / Indicators: Aching;Burning Pain Intervention(s): Monitored during session;Repositioned;Ice applied    Home Living                      Prior Function            PT Goals (current goals can now be found in the care plan section) Progress towards PT goals: Progressing toward goals    Frequency    7X/week      PT Plan Current plan remains appropriate    Co-evaluation              AM-PAC PT "6 Clicks" Daily Activity  Outcome Measure  Difficulty turning over in bed (including adjusting bedclothes, sheets and blankets)?: A Lot Difficulty moving from lying on back to sitting on the side of the bed? : A Lot Difficulty sitting down on and standing up from a chair with arms (e.g., wheelchair, bedside commode, etc,.)?: A Lot Help needed moving to and from a  bed to chair (including a wheelchair)?: A Little Help needed walking in hospital room?: A Little Help needed climbing 3-5 steps with a railing? : A Lot 6 Click Score: 14    End of Session Equipment Utilized During Treatment: Gait belt Activity Tolerance: Patient tolerated treatment well Patient left: in chair;with call bell/phone within reach;with family/visitor present;Other (comment) Nurse Communication: Mobility status PT Visit Diagnosis: Other abnormalities of gait and mobility (R26.89)     Time: 1694-5038 PT Time Calculation (min) (ACUTE ONLY): 17 min  Charges:  $Gait Training: 8-22 mins                    G Codes:       Rica Koyanagi  PTA [facility]  Acute  Rehab Pager      [phone]

## 2018-04-19 LAB — CBC
HCT: 37.3 % (ref 36.0–46.0)
Hemoglobin: 12.6 g/dL (ref 12.0–15.0)
MCH: 31.8 pg (ref 26.0–34.0)
MCHC: 33.8 g/dL (ref 30.0–36.0)
MCV: 94.2 fL (ref 78.0–100.0)
Platelets: 299 10*3/uL (ref 150–400)
RBC: 3.96 MIL/uL (ref 3.87–5.11)
RDW: 13.5 % (ref 11.5–15.5)
WBC: 24 10*3/uL — ABNORMAL HIGH (ref 4.0–10.5)

## 2018-04-19 MED ORDER — METHOCARBAMOL 500 MG PO TABS: 500.0000 mg | ORAL_TABLET | Freq: Two times a day (BID) | ORAL | 0 refills | Status: AC

## 2018-04-19 NOTE — Progress Notes (Signed)
Physical Therapy Treatment Patient Details Name: Hannah Chen MRN: [id number] DOB: [birth year] Today's Date: 04/19/2018    History of Present Illness pt s/p DA THA on 04/17/2018, with h/o LTKA 2016, and back laminectomy 2001. PMH: GERD, HTN and arthritis.     PT Comments    POD # 2 Pt has met goals to D/C to home  Follow Up Recommendations  Follow surgeon's recommendation for DC plan and follow-up therapies;Home health PT     Equipment Recommendations  None recommended by PT    Recommendations for Other Services       Precautions / Restrictions Precautions Precautions: None Restrictions Weight Bearing Restrictions: No    Mobility  Bed Mobility               General bed mobility comments: OOB in recliner  Transfers Overall transfer level: Needs assistance Equipment used: Rolling walker (2 wheeled) Transfers: Sit to/from Stand;Stand Pivot Transfers Sit to Stand: Min guard;Supervision Stand pivot transfers: Supervision;Min guard       General transfer comment: cues for RW safety   Ambulation/Gait Ambulation/Gait assistance: Supervision;Min guard Ambulation Distance (Feet): 85 Feet Assistive device: Rolling walker (2 wheeled) Gait Pattern/deviations: Step-to pattern;Step-through pattern Gait velocity: decreased   General Gait Details: VC's safety with turns   Stairs Stairs: Yes Stairs assistance: Min guard Stair Management: One rail Left;Step to pattern;Forwards Number of Stairs: 2 General stair comments: with spouse present for "hands on" instruction   Wheelchair Mobility    Modified Rankin (Stroke Patients Only)       Balance                                            Cognition Arousal/Alertness: Awake/alert Behavior During Therapy: WFL for tasks assessed/performed Overall Cognitive Status: Within Functional Limits for tasks assessed                                        Exercises   Total Hip  Replacement TE's 10 reps ankle pumps 10 reps knee presses 10 reps heel slides 10 reps SAQ's 10 reps ABD Followed by ICE     General Comments        Pertinent Vitals/Pain Pain Assessment: 0-10 Pain Score: 3  Pain Location: R hip Pain Descriptors / Indicators: Aching;Operative site guarding Pain Intervention(s): Monitored during session;Premedicated before session;Ice applied    Home Living                      Prior Function            PT Goals (current goals can now be found in the care plan section) Progress towards PT goals: Progressing toward goals    Frequency    7X/week      PT Plan Current plan remains appropriate    Co-evaluation              AM-PAC PT "6 Clicks" Daily Activity  Outcome Measure  Difficulty turning over in bed (including adjusting bedclothes, sheets and blankets)?: A Little Difficulty moving from lying on back to sitting on the side of the bed? : A Little Difficulty sitting down on and standing up from a chair with arms (e.g., wheelchair, bedside commode, etc,.)?: A Little Help needed moving to and from a bed  to chair (including a wheelchair)?: A Little Help needed walking in hospital room?: A Little Help needed climbing 3-5 steps with a railing? : A Little 6 Click Score: 18    End of Session Equipment Utilized During Treatment: Gait belt Activity Tolerance: Patient tolerated treatment well Patient left: in chair;with call bell/phone within reach;with family/visitor present;Other (comment) Nurse Communication: Mobility status PT Visit Diagnosis: Other abnormalities of gait and mobility (R26.89)     Time: 8841-6606 PT Time Calculation (min) (ACUTE ONLY): 27 min  Charges:  $Gait Training: 8-22 mins $Therapeutic Activity: 8-22 mins                    G Codes:       Rica Koyanagi  PTA [facility]  Acute  Rehab Pager      [phone]

## 2018-04-19 NOTE — Care Plan (Signed)
Spoke with patient prior to surgery. She will discharge to local airbnb with husband and HHPT provided by [facility] at home. She has all needed equipment at home. She is scheduled to follow up with Dr. Mayer Camel in the clinic on [phone] @ 1000. She will follow with OPPT there if needed.   Please contact Ladell Heads, [place] with questions or if this plan should need to change.   Thanks

## 2018-04-19 NOTE — Discharge Summary (Signed)
Patient ID: Hannah Chen MRN: [id number] DOB/AGE: 04/28/1941 77 y.o.  Admit date: 04/17/2018 Discharge date: 04/19/2018  Admission Diagnoses:  Principal Problem:   Osteoarthritis of right hip Active Problems:   Primary osteoarthritis of right hip   Discharge Diagnoses:  Same  Past Medical History:  Diagnosis Date  . Arthritis    oa  . Cancer (HCC)    squamous cell  . GERD (gastroesophageal reflux disease)   . High calcium levels    dec 2018  . History of hiatal hernia   . Hyperparathyroidism ([place])    mild, will have 1 lobe taken out in fall   . Hypertension   . Osteopenia   . Rash    small red rash upper arms healing    Surgeries: Procedure(s): RIGHT TOTAL HIP ARTHROPLASTY ANTERIOR APPROACH on 04/17/2018   Consultants:   Discharged Condition: Improved  Hospital Course: Hannah Chen is an 77 y.o. female who was admitted 04/17/2018 for operative treatment ofOsteoarthritis of right hip. Patient has severe unremitting pain that affects sleep, daily activities, and work/hobbies. After pre-op clearance the patient was taken to the operating room on 04/17/2018 and underwent  Procedure(s): RIGHT TOTAL HIP ARTHROPLASTY ANTERIOR APPROACH.    Patient was given perioperative antibiotics:  Anti-infectives (From admission, onward)   Start     Dose/Rate Route Frequency Ordered Stop   04/17/18 0715  ceFAZolin (ANCEF) IVPB 2g/100 mL premix     2 g 200 mL/hr over 30 Minutes Intravenous On call to O.R. 04/17/18 0710 04/17/18 1000       Patient was given sequential compression devices, early ambulation, and chemoprophylaxis to prevent DVT.  Patient benefited maximally from hospital stay and there were no complications.    Recent vital signs:  Patient Vitals for the past 24 hrs:  BP Temp Temp src Pulse Resp SpO2  04/19/18 0611 (!) 126/59 98.6 F (37 C) Oral 73 18 93 %  04/18/18 2117 (!) 130/58 98.5 F (36.9 C) Oral 76 19 95 %  04/18/18 1027 138/74 98.2 F (36.8 C) Oral 72 17 96 %     Recent laboratory studies:  Recent Labs    04/18/18 0546 04/19/18 0545  WBC 20.6* 24.0*  HGB 12.6 12.6  HCT 37.5 37.3  PLT 295 299  NA 141  --   K 4.7  --   CL 106  --   CO2 27  --   BUN 13  --   CREATININE 0.72  --   GLUCOSE 142*  --   CALCIUM 9.9  --      Discharge Medications:   Allergies as of 04/19/2018      Reactions   Adhesive [tape]    Red mark, paper tape ok   Hydrocodone-acetaminophen Nausea And Vomiting   Latex    Rash and itching   Neosporin [neomycin-bacitracin Zn-polymyx]    Bacitracin=rash   Sulfa Antibiotics Hives      Medication List    STOP taking these medications   acetaminophen 500 MG tablet Commonly known as:  TYLENOL   tiZANidine 2 MG tablet Commonly known as:  ZANAFLEX     TAKE these medications   amLODipine 10 MG tablet Commonly known as:  NORVASC Take 10 mg by mouth every morning.   aspirin EC 81 MG tablet Take 1 tablet (81 mg total) by mouth 2 (two) times daily. What changed:    medication strength  how much to take   cetirizine 10 MG tablet Commonly known as:  ZYRTEC  Take 10 mg by mouth daily as needed for allergies.   CO Q-10 PO Take 1 capsule by mouth daily.   losartan 50 MG tablet Commonly known as:  COZAAR Take 50 mg by mouth daily.   methocarbamol 500 MG tablet Commonly known as:  ROBAXIN Take 1 tablet (500 mg total) by mouth 2 (two) times daily with a meal.   MULTIVITAMIN ADULT Tabs Take 1 tablet by mouth daily.   omeprazole 20 MG capsule Commonly known as:  PRILOSEC Take 20 mg by mouth daily.   oxyCODONE-acetaminophen 5-325 MG tablet Commonly known as:  PERCOCET/ROXICET Take 1 tablet by mouth every 4 (four) hours as needed for severe pain. What changed:  reasons to take this   ranitidine 150 MG tablet Commonly known as:  ZANTAC Take 150 mg by mouth every evening.   TURMERIC PO Take 1 tablet by mouth daily as needed (pain).            Durable Medical Equipment  (From admission, onward)         Start     Ordered   04/17/18 1317  DME Walker rolling  Once    Question:  Patient needs a walker to treat with the following condition  Answer:  Status post right hip replacement   04/17/18 1316   04/17/18 1317  DME 3 n 1  Once     04/17/18 1316       Discharge Care Instructions  (From admission, onward)        Start     Ordered   04/19/18 0000  Weight bearing as tolerated     04/19/18 0741      Diagnostic Studies: Dg Chest 2 View  Result Date: 04/10/2018 CLINICAL DATA:  Preoperative examination. No current chest complaints. History of hypertension, nonsmoker. EXAM: CHEST - 2 VIEW COMPARISON:  PA and lateral chest x-ray of June 05, 2015 FINDINGS: The lungs are well-expanded and clear. The heart and pulmonary vascularity are normal. The mediastinum is normal in width. There is no pleural effusion. The bony thorax exhibits no acute abnormality. IMPRESSION: There is no active cardiopulmonary disease. Electronically Signed   By: David  Martinique M.D.   On: 04/10/2018 16:38   Dg C-arm 1-60 Min-no Report  Result Date: 04/17/2018 Fluoroscopy was utilized by the requesting physician.  No radiographic interpretation.   Dg Hip Operative Unilat W Or W/o Pelvis Right  Result Date: 04/17/2018 CLINICAL DATA:  Right hip replacement EXAM: OPERATIVE RIGHT HIP WITH PELVIS COMPARISON:  None. FLUOROSCOPY TIME:  Radiation Exposure Index (as provided by the fluoroscopic device): 2.25 mGy If the device does not provide the exposure index: Fluoroscopy Time:  22 seconds Number of Acquired Images:  2 FINDINGS: Right hip replacement is noted in satisfactory position. No acute bony or soft tissue abnormality is seen. IMPRESSION: Status post right hip replacement Electronically Signed   By: Inez Catalina M.D.   On: 04/17/2018 11:37    Disposition: Discharge disposition: 01-Home or Self Care       Discharge Instructions    Call MD / Call 911   Complete by:  As directed    If you experience chest  pain or shortness of breath, CALL 911 and be transported to the hospital emergency room.  If you develope a fever above 101 F, pus (white drainage) or increased drainage or redness at the wound, or calf pain, call your surgeon's office.   Constipation Prevention   Complete by:  As directed  Drink plenty of fluids.  Prune juice may be helpful.  You may use a stool softener, such as Colace (over the counter) 100 mg twice a day.  Use MiraLax (over the counter) for constipation as needed.   Diet - low sodium heart healthy   Complete by:  As directed    Driving restrictions   Complete by:  As directed    No driving for 2 weeks   Increase activity slowly as tolerated   Complete by:  As directed    Patient may shower   Complete by:  As directed    You may shower without a dressing once there is no drainage.  Do not wash over the wound.  If drainage remains, cover wound with plastic wrap and then shower.   Weight bearing as tolerated   Complete by:  As directed       Follow-up Information    Frederik Pear, MD In 2 weeks.   Specialty:  Orthopedic Surgery Contact information: [place] [address] [phone]        [facility] Follow up.   Specialty:  [place]:  physical therapy Contact information: [address] [place] [address] [phone]            Signed: Joanell Rising 04/19/2018, 7:41 AM

## 2018-04-19 NOTE — Progress Notes (Signed)
PATIENT ID: Hannah Chen  MRN: [id number]  DOB/AGE:  [birth year] / 77 y.o.  2 Days Post-Op Procedure(s) (LRB): RIGHT TOTAL HIP ARTHROPLASTY ANTERIOR APPROACH (Right)    PROGRESS NOTE Subjective: Patient is alert, oriented, no Nausea, no Vomiting, yes passing gas, . Taking PO well. Denies SOB, Chest or Calf Pain. Using Incentive Spirometer, PAS in place. Ambulate WBAT with pt walking 63 ft with therapy Patient reports pain as  5/10  .    Objective: Vital signs in last 24 hours: Vitals:   04/18/18 0602 04/18/18 1027 04/18/18 2117 04/19/18 0611  BP: 122/61 138/74 (!) 130/58 (!) 126/59  Pulse: 76 72 76 73  Resp: 17 17 19 18   Temp: 98.2 F (36.8 C) 98.2 F (36.8 C) 98.5 F (36.9 C) 98.6 F (37 C)  TempSrc: Oral Oral Oral Oral  SpO2: 96% 96% 95% 93%  Weight:      Height:          Intake/Output from previous day: I/O last 3 completed shifts: In: 4648.3 [P.O.:1540; I.V.:3108.3] Out: 2650 [Urine:2650]   Intake/Output this shift: No intake/output data recorded.   LABORATORY DATA: Recent Labs    04/18/18 0546 04/19/18 0545  WBC 20.6* 24.0*  HGB 12.6 12.6  HCT 37.5 37.3  PLT 295 299  NA 141  --   K 4.7  --   CL 106  --   CO2 27  --   BUN 13  --   CREATININE 0.72  --   GLUCOSE 142*  --   CALCIUM 9.9  --     Examination: Neurologically intact Neurovascular intact Sensation intact distally Intact pulses distally Dorsiflexion/Plantar flexion intact Incision: dressing C/D/I and no drainage No cellulitis present Compartment soft} XR AP&Lat of hip shows well placed\fixed THA  Assessment:   2 Days Post-Op Procedure(s) (LRB): RIGHT TOTAL HIP ARTHROPLASTY ANTERIOR APPROACH (Right) ADDITIONAL DIAGNOSIS:  Expected Acute Blood Loss Anemia, Hypertension  Plan: PT/OT WBAT, THA  DVT Prophylaxis: SCDx72 hrs, ASA 81 mg BID x 4 weeks  DISCHARGE PLAN: Home, later today once pt meets therapy goals  DISCHARGE NEEDS: HHPT, Walker and 3-in-1 comode seat

## 2019-04-21 IMAGING — RF DG HIP (WITH PELVIS) OPERATIVE*R*
1 series · 2 of 2 positions shown · non-contrast
Comparison: None.

CLINICAL DATA: Right hip replacement

EXAM:
OPERATIVE RIGHT HIP WITH PELVIS

[Series 1: run · 2 of 2 slices shown]
[im 1/2]
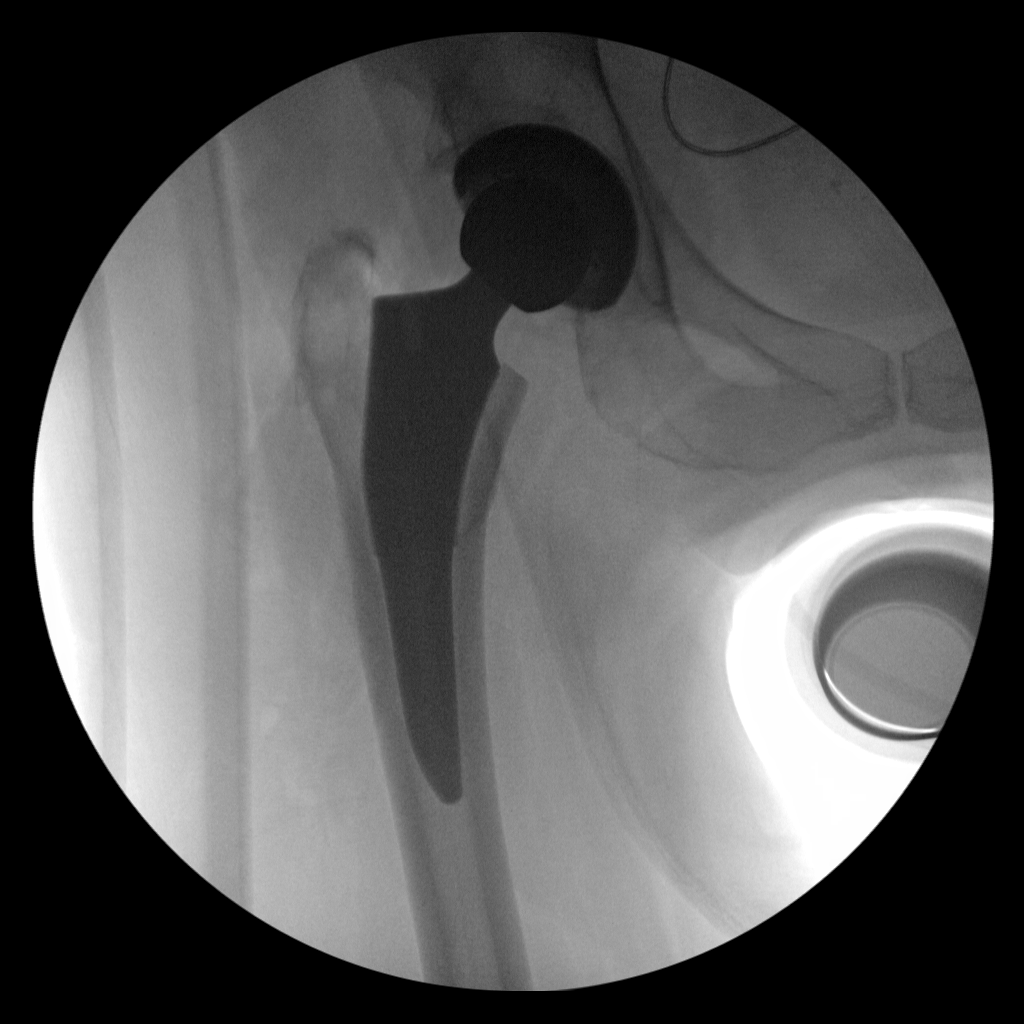
[im 2/2]
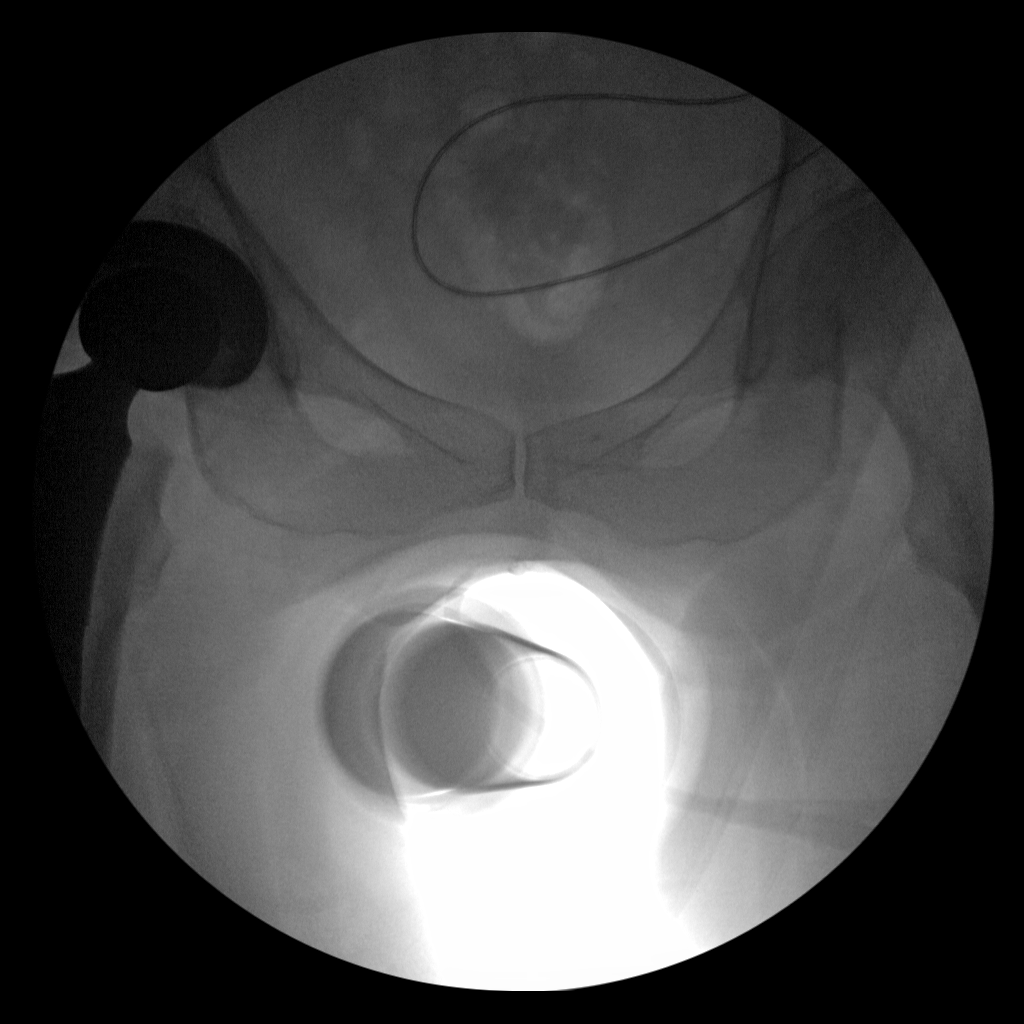

[2 of 2 positions shown; findings below may reference images not displayed]

FLUOROSCOPY TIME:  Radiation Exposure Index (as provided by the
fluoroscopic device): 2.25 mGy

If the device does not provide the exposure index:

Fluoroscopy Time:  22 seconds

Number of Acquired Images:  2
FINDINGS: Right hip replacement is noted in satisfactory position. No acute
bony or soft tissue abnormality is seen.
IMPRESSION: Status post right hip replacement

## 2024-07-26 ENCOUNTER — Other Ambulatory Visit: Payer: Self-pay | Admitting: Internal Medicine

## 2024-07-26 DIAGNOSIS — Z1231 Encounter for screening mammogram for malignant neoplasm of breast: Secondary | ICD-10-CM

## 2024-08-02 ENCOUNTER — Ambulatory Visit
Admission: RE | Admit: 2024-08-02 | Discharge: 2024-08-02 | Disposition: A | Discharge status: Home / Self Care | Source: Ambulatory Visit | Attending: Internal Medicine | Admitting: Internal Medicine

## 2024-08-02 DIAGNOSIS — Z1231 Encounter for screening mammogram for malignant neoplasm of breast: Secondary | ICD-10-CM

## 2024-08-26 ENCOUNTER — Other Ambulatory Visit: Payer: Self-pay
